# Patient Record
Sex: Male | Born: 1955 | Race: White | Hispanic: No | Marital: Single | Smoking: Current every day smoker
Health system: Southern US, Community
[De-identification: ages and names within clinical notes are randomized; demographics above are authoritative.]

## PROBLEM LIST (undated history)

## (undated) DIAGNOSIS — R12 Heartburn: Secondary | ICD-10-CM

## (undated) DIAGNOSIS — C801 Malignant (primary) neoplasm, unspecified: Secondary | ICD-10-CM

## (undated) HISTORY — PX: ANKLE FRACTURE SURGERY: SHX122

## (undated) HISTORY — DX: Malignant (primary) neoplasm, unspecified: C80.1

## (undated) HISTORY — DX: Heartburn: R12

---

## 2016-10-05 ENCOUNTER — Other Ambulatory Visit (HOSPITAL_COMMUNITY): Payer: Self-pay | Admitting: Oncology

## 2016-10-05 DIAGNOSIS — K769 Liver disease, unspecified: Secondary | ICD-10-CM

## 2016-10-12 ENCOUNTER — Ambulatory Visit (HOSPITAL_COMMUNITY)
Admission: RE | Admit: 2016-10-12 | Discharge: 2016-10-12 | Disposition: A | Discharge status: Home / Self Care | Payer: Medicaid Other | Source: Ambulatory Visit | Attending: Oncology | Admitting: Oncology

## 2016-10-12 DIAGNOSIS — R918 Other nonspecific abnormal finding of lung field: Secondary | ICD-10-CM | POA: Insufficient documentation

## 2016-10-12 DIAGNOSIS — J439 Emphysema, unspecified: Secondary | ICD-10-CM | POA: Insufficient documentation

## 2016-10-12 DIAGNOSIS — I2510 Atherosclerotic heart disease of native coronary artery without angina pectoris: Secondary | ICD-10-CM | POA: Insufficient documentation

## 2016-10-12 DIAGNOSIS — K769 Liver disease, unspecified: Secondary | ICD-10-CM | POA: Diagnosis present

## 2016-10-12 DIAGNOSIS — R160 Hepatomegaly, not elsewhere classified: Secondary | ICD-10-CM | POA: Diagnosis not present

## 2016-10-12 DIAGNOSIS — I700 Atherosclerosis of aorta: Secondary | ICD-10-CM | POA: Insufficient documentation

## 2016-10-12 MED ORDER — GADOBENATE DIMEGLUMINE 529 MG/ML IV SOLN
14.0000 mL | Freq: Once | INTRAVENOUS | Status: AC | PRN
  Administered 2016-10-12: 14 mL via INTRAVENOUS

## 2016-10-16 ENCOUNTER — Encounter (HOSPITAL_COMMUNITY): Payer: Medicaid Other | Attending: Hematology & Oncology | Admitting: Hematology & Oncology

## 2016-10-16 ENCOUNTER — Observation Stay (HOSPITAL_COMMUNITY)
Admission: EM | Admit: 2016-10-16 | Discharge: 2016-10-18 | Disposition: A | Discharge status: Home / Self Care | Payer: Medicaid Other | Attending: Internal Medicine | Admitting: Internal Medicine

## 2016-10-16 ENCOUNTER — Encounter (HOSPITAL_COMMUNITY): Payer: Self-pay | Admitting: Hematology & Oncology

## 2016-10-16 ENCOUNTER — Encounter (HOSPITAL_COMMUNITY): Payer: Self-pay

## 2016-10-16 VITALS — BP 157/105 | HR 111 | Temp 98.2°F | Resp 20 | Ht 66.25 in | Wt 139.2 lb

## 2016-10-16 DIAGNOSIS — C229 Malignant neoplasm of liver, not specified as primary or secondary: Secondary | ICD-10-CM | POA: Diagnosis not present

## 2016-10-16 DIAGNOSIS — C220 Liver cell carcinoma: Secondary | ICD-10-CM | POA: Diagnosis not present

## 2016-10-16 DIAGNOSIS — D75839 Thrombocytosis, unspecified: Secondary | ICD-10-CM | POA: Diagnosis present

## 2016-10-16 DIAGNOSIS — G893 Neoplasm related pain (acute) (chronic): Secondary | ICD-10-CM

## 2016-10-16 DIAGNOSIS — R109 Unspecified abdominal pain: Secondary | ICD-10-CM | POA: Diagnosis present

## 2016-10-16 DIAGNOSIS — F1010 Alcohol abuse, uncomplicated: Secondary | ICD-10-CM

## 2016-10-16 DIAGNOSIS — Z79899 Other long term (current) drug therapy: Secondary | ICD-10-CM | POA: Diagnosis not present

## 2016-10-16 DIAGNOSIS — F1910 Other psychoactive substance abuse, uncomplicated: Secondary | ICD-10-CM

## 2016-10-16 DIAGNOSIS — F17210 Nicotine dependence, cigarettes, uncomplicated: Secondary | ICD-10-CM | POA: Insufficient documentation

## 2016-10-16 DIAGNOSIS — K769 Liver disease, unspecified: Secondary | ICD-10-CM

## 2016-10-16 DIAGNOSIS — D72829 Elevated white blood cell count, unspecified: Secondary | ICD-10-CM | POA: Diagnosis present

## 2016-10-16 DIAGNOSIS — R1010 Upper abdominal pain, unspecified: Secondary | ICD-10-CM | POA: Diagnosis present

## 2016-10-16 DIAGNOSIS — D649 Anemia, unspecified: Secondary | ICD-10-CM | POA: Diagnosis present

## 2016-10-16 DIAGNOSIS — Z659 Problem related to unspecified psychosocial circumstances: Secondary | ICD-10-CM

## 2016-10-16 DIAGNOSIS — R945 Abnormal results of liver function studies: Secondary | ICD-10-CM | POA: Diagnosis present

## 2016-10-16 DIAGNOSIS — D473 Essential (hemorrhagic) thrombocythemia: Secondary | ICD-10-CM | POA: Diagnosis present

## 2016-10-16 DIAGNOSIS — R7989 Other specified abnormal findings of blood chemistry: Secondary | ICD-10-CM | POA: Diagnosis present

## 2016-10-16 LAB — LIPASE, BLOOD: LIPASE: 27 U/L (ref 11–51)

## 2016-10-16 LAB — CBC WITH DIFFERENTIAL/PLATELET
BASOS PCT: 0 %
Basophils Absolute: 0 10*3/uL (ref 0.0–0.1)
EOS PCT: 1 %
Eosinophils Absolute: 0.1 10*3/uL (ref 0.0–0.7)
HEMATOCRIT: 33.6 % — AB (ref 39.0–52.0)
Hemoglobin: 11.4 g/dL — ABNORMAL LOW (ref 13.0–17.0)
LYMPHS PCT: 17 %
Lymphs Abs: 2.2 10*3/uL (ref 0.7–4.0)
MCH: 32.1 pg (ref 26.0–34.0)
MCHC: 33.9 g/dL (ref 30.0–36.0)
MCV: 94.6 fL (ref 78.0–100.0)
MONO ABS: 1.7 10*3/uL — AB (ref 0.1–1.0)
Monocytes Relative: 13 %
NEUTROS PCT: 69 %
Neutro Abs: 9.2 10*3/uL — ABNORMAL HIGH (ref 1.7–7.7)
PLATELETS: 731 10*3/uL — AB (ref 150–400)
RBC: 3.55 MIL/uL — AB (ref 4.22–5.81)
RDW: 16.4 % — AB (ref 11.5–15.5)
WBC: 13.2 10*3/uL — AB (ref 4.0–10.5)

## 2016-10-16 LAB — COMPREHENSIVE METABOLIC PANEL
ALBUMIN: 2.6 g/dL — AB (ref 3.5–5.0)
ALT: 215 U/L — AB (ref 17–63)
AST: 249 U/L — AB (ref 15–41)
Alkaline Phosphatase: 632 U/L — ABNORMAL HIGH (ref 38–126)
Anion gap: 6 (ref 5–15)
BILIRUBIN TOTAL: 1.8 mg/dL — AB (ref 0.3–1.2)
BUN: 14 mg/dL (ref 6–20)
CHLORIDE: 101 mmol/L (ref 101–111)
CO2: 27 mmol/L (ref 22–32)
CREATININE: 0.81 mg/dL (ref 0.61–1.24)
Calcium: 9 mg/dL (ref 8.9–10.3)
GFR calc Af Amer: >60 mL/min (ref >60)
GLUCOSE: 98 mg/dL (ref 65–99)
POTASSIUM: 4.4 mmol/L (ref 3.5–5.1)
Sodium: 134 mmol/L — ABNORMAL LOW (ref 135–145)
Total Protein: 7.6 g/dL (ref 6.5–8.1)

## 2016-10-16 LAB — PROTIME-INR
INR: 0.9
Prothrombin Time: 12.1 seconds (ref 11.4–15.2)

## 2016-10-16 MED ORDER — NICOTINE 7 MG/24HR TD PT24: 7.0000 mg | MEDICATED_PATCH | Freq: Every day | TRANSDERMAL | Status: DC

## 2016-10-16 MED ORDER — ONDANSETRON HCL 4 MG PO TABS: 4.0000 mg | ORAL_TABLET | Freq: Four times a day (QID) | ORAL | Status: DC | PRN

## 2016-10-16 MED ORDER — FAMOTIDINE IN NACL 20-0.9 MG/50ML-% IV SOLN
20.0000 mg | Freq: Two times a day (BID) | INTRAVENOUS | Status: DC
  Administered 2016-10-16 – 2016-10-17 (×3): 20 mg via INTRAVENOUS
  Filled 2016-10-16 (×4): qty 50

## 2016-10-16 MED ORDER — MORPHINE SULFATE (PF) 4 MG/ML IV SOLN
4.0000 mg | INTRAVENOUS | Status: DC | PRN
  Administered 2016-10-17 – 2016-10-18 (×3): 4 mg via INTRAVENOUS
  Filled 2016-10-16 (×3): qty 1

## 2016-10-16 MED ORDER — SODIUM CHLORIDE 0.9 % IV SOLN
INTRAVENOUS | Status: DC
  Administered 2016-10-16 – 2016-10-17 (×2): via INTRAVENOUS

## 2016-10-16 MED ORDER — OXYCODONE HCL 5 MG PO TABS
ORAL_TABLET | ORAL | Status: AC
  Filled 2016-10-16: qty 2

## 2016-10-16 MED ORDER — OMEPRAZOLE 40 MG PO CPDR: 40.0000 mg | DELAYED_RELEASE_CAPSULE | Freq: Every day | ORAL | 1 refills | Status: AC

## 2016-10-16 MED ORDER — OXYCODONE HCL 10 MG PO TABS: 10.0000 mg | ORAL_TABLET | ORAL | 0 refills | Status: AC | PRN

## 2016-10-16 MED ORDER — ONDANSETRON HCL 4 MG/2ML IJ SOLN
4.0000 mg | Freq: Four times a day (QID) | INTRAMUSCULAR | Status: DC | PRN
  Administered 2016-10-17: 4 mg via INTRAVENOUS
  Filled 2016-10-16 (×2): qty 2

## 2016-10-16 MED ORDER — OXYCODONE HCL 5 MG PO TABS
10.0000 mg | ORAL_TABLET | Freq: Once | ORAL | Status: AC
  Administered 2016-10-16: 10 mg via ORAL

## 2016-10-16 MED ORDER — MORPHINE SULFATE (PF) 4 MG/ML IV SOLN
4.0000 mg | Freq: Once | INTRAVENOUS | Status: AC
  Administered 2016-10-16: 4 mg via INTRAVENOUS
  Filled 2016-10-16: qty 1

## 2016-10-16 NOTE — ED Provider Notes (Signed)
[facility] DEPT Provider Note   CSN: [id number] Arrival date & time: 10/16/16  1705     History   Chief Complaint Chief Complaint  Patient presents with  . Abdominal Pain    HPI Micheal Kim is a 60 y.o. male.  HPI The patient presents to the emergency room to be admitted for further cancer workup. Patient states he has been diagnosed with liver cancer. He has been having persistent pain in his upper abdomen. Patient followed up in the oncology office today. [facility]. They reviewed his recent imaging that included an MRI of the abdomen on November 9 and a CT scan of the chest on November 9. Those findings demonstrated innumerable liver masses throughout both lobes of the liver. Patient also had evidence of nodules on the chest CT. The patient was instructed to be admitted to the hospital for pain control and arrange for his biopsy. She denies any fevers or chills. No vomiting or diarrhea. Past Medical History:  Diagnosis Date  . Cancer [facility])    liver cancer  . Heartburn     There are no active problems to display for this patient.   Past Surgical History:  Procedure Laterality Date  . ANKLE FRACTURE SURGERY Right    x2       Home Medications    Prior to Admission medications   Medication Sig Start Date End Date Taking? Authorizing Provider  calcium carbonate (TUMS - DOSED IN MG ELEMENTAL CALCIUM) 500 MG chewable tablet Chew 10 tablets by mouth daily as needed for indigestion or heartburn.   Yes Historical Provider, MD  esomeprazole (NEXIUM 24HR) 20 MG capsule Take 20 mg by mouth daily at 12 noon.   Yes Historical Provider, MD  omeprazole (PRILOSEC) 40 MG capsule Take 1 capsule (40 mg total) by mouth daily. 10/16/16   Patrici Ranks, MD  oxyCODONE 10 MG TABS Take 1 tablet (10 mg total) by mouth every 4 (four) hours as needed for severe pain. 10/16/16   Patrici Ranks, MD    Family History Family History  Problem Relation Age of Onset  .  Colon cancer Mother   . Heart attack Father     Social History Social History  Substance Use Topics  . Smoking status: Current Every Day Smoker    Types: Cigarettes  . Smokeless tobacco: Never Used     Comment: started smoking age 32  . Alcohol use No     Comment: quit driking 10/04/16-drank 10 beers and /or 5th od liquer daily     Allergies   Patient has no known allergies.   Review of Systems Review of Systems  All other systems reviewed and are negative.    Physical Exam Updated Vital Signs BP 148/79 (BP Location: Left Arm)   Pulse 95   Temp 98.5 F (36.9 C) (Oral)   Resp 20   SpO2 97%   Physical Exam  Constitutional: No distress.  Thin, underweight  HENT:  Head: Normocephalic and atraumatic.  Right Ear: External ear normal.  Left Ear: External ear normal.  Eyes: Conjunctivae are normal. Right eye exhibits no discharge. Left eye exhibits no discharge. No scleral icterus.  Neck: Neck supple. No tracheal deviation present.  Cardiovascular: Normal rate, regular rhythm and intact distal pulses.   Pulmonary/Chest: Effort normal and breath sounds normal. No stridor. No respiratory distress. He has no wheezes. He has no rales.  Abdominal: Soft. Bowel sounds are normal. He exhibits mass. He exhibits no distension. There is  hepatomegaly. There is tenderness in the right upper quadrant. There is no rebound and no guarding.  Musculoskeletal: He exhibits no edema or tenderness.  Neurological: He is alert. He has normal strength. No cranial nerve deficit (no facial droop, extraocular movements intact, no slurred speech) or sensory deficit. He exhibits normal muscle tone. He displays no seizure activity. Coordination normal.  Skin: Skin is warm and dry. No rash noted.  Psychiatric: He has a normal mood and affect.  Nursing note and vitals reviewed.    ED Treatments / Results  Labs (all labs ordered are listed, but only abnormal results are displayed) Labs Reviewed  CBC  WITH DIFFERENTIAL/PLATELET - Abnormal; Notable for the following:       Result Value   WBC 13.2 (*)    RBC 3.55 (*)    Hemoglobin 11.4 (*)    HCT 33.6 (*)    RDW 16.4 (*)    Platelets 731 (*)    Neutro Abs 9.2 (*)    Monocytes Absolute 1.7 (*)    All other components within normal limits  COMPREHENSIVE METABOLIC PANEL - Abnormal; Notable for the following:    Sodium 134 (*)    Albumin 2.6 (*)    AST 249 (*)    ALT 215 (*)    Alkaline Phosphatase 632 (*)    Total Bilirubin 1.8 (*)    All other components within normal limits  LIPASE, BLOOD  PROTIME-INR     Procedures Procedures (including critical care time)  Medications Ordered in ED Medications  morphine 4 MG/ML injection 4 mg (4 mg Intravenous Given 10/16/16 1753)     Initial Impression / Assessment and Plan / ED Course  I have reviewed the triage vital signs and the nursing notes.  Pertinent labs & imaging results that were available during my care of the patient were reviewed by me and considered in my medical decision making (see chart for details).  Clinical Course    Patient has a palpable tender liver mass.Laboratory tests are abnormal with elevated LFTs and bilirubin I reviewed the notes from his recent visit on the oncology office. Pt has poor social and financial support.  Admission recommended by oncology. I will order basic labs and consult with medical service to have the patient admitted for his further workup  Final Clinical Impressions(s) / ED Diagnoses   Final diagnoses:  Hepatocellular carcinoma ([place])     Dorie Rank, MD 10/16/16 [phone]

## 2016-10-16 NOTE — Progress Notes (Signed)
[place]  CONSULT NOTE  No care team member to display  CHIEF COMPLAINTS/PURPOSE OF CONSULTATION:  Extensive hepatic abnormality Substance and alcohol abuse RUQ pain  HISTORY OF PRESENTING ILLNESS:  Micheal Kim 60 y.o. male is here because of referral from [facility] ED.   Micheal Kim is a pleasant 60 y.o. who presented to [facility] ED on 10/04/2016 complaining of gradually worsening right flank pain for the past 2 weeks. This pain was exacerbated with breathing and his abdomen was distended. Accordingly a CT Abdomen/Pelvis was performed revealing progression of previously demonstrated masses involving the left hepatic lobe. There was extensive hepatic abnormality, most concerning for multifocal hepatocellular carcinoma in the setting of underlying cirrhosis. Multifocal metastases from an unknown primary less likely. There was also a new small left adrenal nodule, potentially a metastasis. These results were discussed with the patient while in the hospital.   Additional imaging was performed at [facility] including:   10/12/16 MR Abdomen showed innumerable liver masses throughout both lobes of the liver. There was also an enhancing lesion within the left adrenal gland that does not have imaging features of a benign adenoma. This is a new finding from 2015 and is worrisome for metastatic disease.   10/12/16 CT Chest showed a sub solid nodule within the right upper lobe measuring 14 mm. Also seen was a single solid nodule within the subpleural right lower lobe measuring 6 mm.  Micheal Kim presents to the [facility] today unaccompanied.  When he first started getting sick he just assumed he was getting a beer gut about a year ago. He started to feel pain and assumed it was his appendix about to rupture so he went to [facility] ED. He was referred to Dr. Britta Mccreedy who does not manage cancer and was then referred to our clinic.   He smokes about 4  cigarettes daily. He quit drinking alcohol the first of the month.   He reports dry mouth and bitter taste in his mouth. He denies difficulty swallowing.  He reports a knot in his right flank and a knot over his heart that is sore, "I'm eat up". He feels okay enough to go home versus being hospitalized for pain control.  He is not taking anything for pain. Although clearly uncomfortable thoughout our entire visit.   He likes Ensure.   He does not drive. He plans to ride a bus home after his visit today. He does not have any insurance right now. He is able to get to [facility] if he has to, stating he can ride the bus.  When asked how he is doing with all of this he states "It is what it is. A lot of it is self induced from partying throughout the years. I made my bed and I'm gonna lie in it". Admits "It has hit me so quick". He previously had insurance through his wife, "I lost everything I had".   Sometimes he feels like there is mucous coming out of right nostril but when he blows his nose it is blood. This has been happening for the last week. It mostly happens in the mornings. He admits he has electric heat and it may be from the dry heat.  He has been eating very little. His heaviest was probably about 150 lbs.   He understands his liver is enlarged, "I am surprised I can function".   The patient is here for further evaluation and discussion of extensive hepatic disease.  MEDICAL HISTORY:  Past Medical History:  Diagnosis Date  . Cancer [facility])    liver cancer  . Heartburn     SURGICAL HISTORY: Past Surgical History:  Procedure Laterality Date  . ANKLE FRACTURE SURGERY Right    x2    SOCIAL HISTORY: Social History   Social History  . Marital status: Single    Spouse name: N/A  . Number of children: N/A  . Years of education: N/A   Occupational History  . Not on file.   Social History Main Topics  . Smoking status: Current Every Day Smoker    Types: Cigarettes    . Smokeless tobacco: Never Used     Comment: started smoking age 73  . Alcohol use No     Comment: quit driking 10/04/16-drank 10 beers and /or 5th od liquer daily  . Drug use:     Types: Marijuana     Comment: states he has 'done it all' but quit "year ago"  . Sexual activity: Not Currently     Comment: single   Other Topics Concern  . Not on file   Social History Narrative  . No narrative on file  He smokes about 4 cigarettes daily. He quit drinking alcohol the first of the month. Smokes marijuana.  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  He does not drive. He is able to get to [facility] if he has to, stating he can ride the bus. He lives with his son-in-law and an old friend who helps him pay bills  FAMILY HISTORY: Family History  Problem Relation Age of Onset  . Colon cancer Mother   . Heart attack Father     Mother deceased 76 years-old of colon cancer and several different ailments. Father deceased at 15 yars-old of massive heart attack. One sister living about 10 years younger than the patient. They are not that close.  ALLERGIES:  has No Known Allergies.  MEDICATIONS:  Current Outpatient Prescriptions  Medication Sig Dispense Refill  . calcium carbonate (TUMS - DOSED IN MG ELEMENTAL CALCIUM) 500 MG chewable tablet Chew 10 tablets by mouth daily as needed for indigestion or heartburn.    Marland Kitchen omeprazole (PRILOSEC) 40 MG capsule Take 1 capsule (40 mg total) by mouth daily. 30 capsule 1  . oxyCODONE 10 MG TABS Take 1 tablet (10 mg total) by mouth every 4 (four) hours as needed for severe pain. 90 tablet 0   No current facility-administered medications for this visit.     Review of Systems  Constitutional: Negative.   HENT: Positive for nosebleeds.        Dry mouth and bitter taste in mouth  Eyes: Negative.   Respiratory: Negative.   Cardiovascular: Positive for  chest pain.  Gastrointestinal: Positive for abdominal pain and heartburn.       Right flank pain  Genitourinary: Negative.   Musculoskeletal: Negative.   Skin: Negative.   Neurological: Negative.   Endo/Heme/Allergies: Negative.   Psychiatric/Behavioral: Negative.   All other systems reviewed and are negative.  14 point ROS was done and is otherwise as detailed above or in HPI   PHYSICAL EXAMINATION: ECOG PERFORMANCE STATUS: 2 - Symptomatic, <50% confined to bed  Vitals:   10/16/16 1422  BP: (!) 157/105  Pulse: (!) 111  Resp: 20  Temp: 98.2 F (36.8 C)   Filed Weights   10/16/16 1422  Weight: 139 lb 3.2 oz (63.1 kg)    Physical Exam  Constitutional: He is oriented to person, place, and time and well-developed, well-nourished, and in no distress.  HENT:  Head: Normocephalic and atraumatic.  Mouth/Throat: Oropharynx is clear and moist. No oropharyngeal exudate.  Eyes: Conjunctivae and EOM are normal. Pupils are equal, round, and reactive to light. No scleral icterus.  Neck: Normal range of motion. Neck supple.  Cardiovascular: Normal rate, regular rhythm and normal heart sounds.   Pulmonary/Chest: Effort normal and breath sounds normal. No respiratory distress. He has no wheezes. He has no rales. He exhibits no tenderness.  Abdominal: Soft. Bowel sounds are normal. He exhibits distension. There is tenderness. There is guarding.  Hepatomegaly  Musculoskeletal: Normal range of motion. He exhibits edema.  Lymphadenopathy:    He has no cervical adenopathy.  Neurological: He is alert and oriented to person, place, and time. Gait normal.  Skin: Skin is warm and dry.  Psychiatric: Mood, memory, affect and judgment normal.  Nursing note and vitals reviewed.   LABORATORY DATA:  I have reviewed the data as listed       RADIOGRAPHIC STUDIES: I have personally reviewed the radiological images as listed and agreed with the findings in the report. Ct Chest Wo  Contrast  Result Date: 10/13/2016 CLINICAL DATA:  History of liver cancer. EXAM: CT CHEST WITHOUT CONTRAST TECHNIQUE: Multidetector CT imaging of the chest was performed following the standard protocol without IV contrast. COMPARISON:  None FINDINGS: Cardiovascular: The heart size appears normal. No pericardial effusion. Aortic atherosclerosis noted. Calcification in the LAD coronary artery noted. Mediastinum/Nodes: The trachea appears patent and is midline. Normal appearance of the esophagus. There is no mediastinal or hilar adenopathy. Left lobe of thyroid gland is absent or atrophic. No mediastinal or hilar adenopathy. No axillary or supraclavicular adenopathy. Lungs/Pleura: There is no pleural fluid identified. Moderate changes of paraseptal and centrilobular emphysema identified. Within the right lung apex there is a part solid nodule which measures 1.4 cm, image 25 of series 3. There is a subpleural nodule which overlies the posterior medial right lower lobe. This measures 6 mm, image 69 of series 3. Upper Abdomen: Cirrhotic appearing liver containing multiple masses are identified. See report from MRI dated 10/12/2016 for more details. 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 There are no aggressive lytic or sclerotic bone lesions. IMPRESSION: 1. No acute cardiopulmonary abnormalities. 2. There is a sub solid nodule within the right upper lobe measuring 14 mm. Follow-up non-contrast CT recommended at 3-6 months to confirm persistence. If unchanged, and solid component remains <6 mm, annual CT is recommended until 5 years of stability has been established. If persistent these nodules should be considered highly suspicious if the solid component of the nodule is 6 mm or greater in size and enlarging. This recommendation follows the consensus statement: Guidelines for Management of Incidental Pulmonary Nodules Detected on CT Images: From the Fleischner Society 2017;  Radiology 2017; 284:228-243. 3. Single solid nodule within the subpleural right lower lobe measures 6 mm. Non-contrast chest CT at 6-12 months is recommended. If the nodule is stable at time of repeat CT, then future CT at 18-24 months (from today's scan) is recommended for high-risk patients. This recommendation follows the consensus statement: Guidelines for Management of Incidental Pulmonary Nodules Detected on CT Images:From the Fleischner Society 2017; published online before print (10.1148/radiol.IJ:2314499). 4. Aortic atherosclerosis and coronary artery calcification 5. Emphysema. Electronically Signed   By: Queen Slough.D.  On: 10/13/2016 09:23   Mr Abdomen W Wo Contrast  Result Date: 10/13/2016 CLINICAL DATA:  Evaluate liver lesions. EXAM: MRI ABDOMEN WITHOUT AND WITH CONTRAST TECHNIQUE: Multiplanar multisequence MR imaging of the abdomen was performed both before and after the administration of intravenous contrast. CONTRAST:  28mL MULTIHANCE GADOBENATE DIMEGLUMINE 529 MG/ML IV SOLN COMPARISON:  08/17/14 FINDINGS: Lower chest: No acute findings. Hepatobiliary: The liver is enlarged. There are innumerable masses of varying size throughout both lobes of the liver worrisome for multifocal about a cellular carcinoma versus diffuse metastatic disease. Index lesion within posterior right lobe of liver measures 2.7 cm, image 19 of series 21. Index lesion within the lateral segment of left lobe of liver measures 3.5 cm, image 60 of series 5009 lesion within the medial segment of left lobe of liver measures 2.3 cm, image 35 of series 5009. Large exophytic lesion arising from the far lateral aspect of the left lobe of liver measures 5.4 cm, image 22 of series 5009. Pancreas: No mass, inflammatory changes, or other parenchymal abnormality identified. Spleen:  Within normal limits in size and appearance. Adrenals/Urinary Tract: The right adrenal gland appears normal. There is a solid enhancing nodule in the  left adrenal gland which measures 1.3 cm, image 30 of series 5005. Hemorrhagic cyst in the right kidney measures 6 mm. Unremarkable appearance of the left kidney. Several simple appearing cysts are identified within the inferior pole of the left kidney. No mass or hydronephrosis noted. Stomach/Bowel: Visualized portions within the abdomen are unremarkable. Vascular/Lymphatic: No pathologically enlarged lymph nodes identified. No abdominal aortic aneurysm demonstrated. Other:  There is a small volume of perihepatic ascites. Musculoskeletal: No suspicious bone lesions identified. IMPRESSION: 1. Innumerable liver masses are identified throughout both lobes of liver. 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 Differential considerations include diffuse liver metastases from unknown primary. 2. Enhancing lesion within left adrenal gland does not have imaging features of a benign adenoma. This is a new finding from 2015 and is worrisome for metastatic disease. Electronically Signed   By: Kerby Moors M.D.   On: 10/13/2016 10:24      ASSESSMENT & PLAN:  Extensive hepatic metastatic disease Substance and alcohol abuse RUQ pain Abdominal distension Abnormal LFT's  The patient is here for further evaluation and discussion of innumerable liver masses in the setting of cirrhosis. Has a known history of alcohol use. Hepatitis serologies are unknown.   10/12/16 MR Abdomen reviewed. This showed innumerable liver masses throughout both lobes of the liver. There was also an enhancing lesion within the left adrenal gland that does not have imaging features of a benign adenoma. This is a new finding from 2015 and is worrisome for metastatic disease.   10/12/16 CT Chest reviewed. This showed a sub solid nodule within the right upper lobe measuring 14 mm. Also seen was a single solid nodule within the subpleural right lower lobe measuring 6 mm.   He will be set up for  ultrasound guided biopsy of hepatic mass. We will send tumor markers and do labs today. I will see if the patient can have his biopsy performed at [facility] due to transportation issues.   Given pain medication Oxycodone and heartburn medication Nexium in our clinic today.  I have prescribed oxycodone, omeprazole, and steroid. Hopefully dexamethasone will help his liver capsular pain. If he has [place], his disease is impressive, he is not a candidate for liver directed therapies, I do not feel given his declining PS he  would tolerate nexavar. We will regroup post biopsy.   I will speak with Ovid Curd about getting Ensure samples.  He does not have insurance. He does not have transportation. He took 3 buses to get here from [place].   After our visit, the patient decided he would like to be admitted to the hospital for pain control. This was arranged. Will obtain labs on the inpatient service including hepatitis serologies and alpha fetoprotein.   He will return for follow up within 3 days post biopsy to discuss these results.   ORDERS PLACED FOR THIS ENCOUNTER: No orders of the defined types were placed in this encounter.  Orders Placed This Encounter  Procedures  . Ambulatory referral to Social Work    Referral Priority:   Routine    Referral Type:   Consultation    Referral Reason:   Specialty Services Required    Number of Visits Requested:   1     MEDICATIONS PRESCRIBED THIS ENCOUNTER: Meds ordered this encounter  Medications  . calcium carbonate (TUMS - DOSED IN MG ELEMENTAL CALCIUM) 500 MG chewable tablet    Sig: Chew 10 tablets by mouth daily as needed for indigestion or heartburn.  Marland Kitchen oxyCODONE (Oxy IR/ROXICODONE) immediate release tablet 10 mg  . oxyCODONE 10 MG TABS    Sig: Take 1 tablet (10 mg total) by mouth every 4 (four) hours as needed for severe pain.    Dispense:  90 tablet    Refill:  0  . omeprazole (PRILOSEC) 40 MG capsule    Sig: Take 1 capsule (40 mg total) by mouth  daily.    Dispense:  30 capsule    Refill:  1    All questions were answered. The patient knows to call the clinic with any problems, questions or concerns.  This document serves as a record of services personally performed by Ancil Linsey, MD. It was created on her behalf by Arlyce Harman, a trained medical scribe. The creation of this record is based on the scribe's personal observations and the provider's statements to them. This document has been checked and approved by the attending provider.  I have reviewed the above documentation for accuracy and completeness and I agree with the above.  This note was electronically signed.  Molli Hazard, MD   10/16/2016 3:16 PM

## 2016-10-16 NOTE — ED Notes (Signed)
EKG @ 17:30 completed. Did not print . No order.

## 2016-10-16 NOTE — ED Triage Notes (Signed)
[facility] states he does not know why he is here. States he thinks he was sent down here to be admit to the hospital.

## 2016-10-16 NOTE — ED Notes (Signed)
Pt now states he was here on the 9th and thinks he was suppose to come back today for a follow up visit. States some woman came in his room and ask him if he wanted to stay over night in the hospital.

## 2016-10-16 NOTE — H&P (Signed)
History and Physical    Micheal Kim [id number] DOB: 16-Mar-1956 DOA: 10/16/2016  PCP: No PCP Per Patient   Patient coming from: Oncology clinic.  Chief Complaint: Abdominal pain.  HPI: Micheal Kim is a 60 y.o. male with medical history significant of liver cancer who is coming to the emergency department referred by the oncology clinic for treatment of abdominal pain and possible biopsy in the morning if possible for liver metastases.  Per patient, he started having abdominal pain about a year ago. Since then he states that he has lost about 15-20 pounds of weight. However, he states that lately his abdominal pain has increased in intensity. He complains of occasional nausea, but denies recent emesis, diarrhea or constipation. He denies chest pain, dyspnea, palpitations, pitting edema of the lower extremities, PND orthopnea. He denies GU symptoms.  ED Course: Workup in the emergency department showed WBC of 13.2, hemoglobin level of 11.3 g/dL, Thrombocytosis of unknown 131, albumin 2.6 g/dL, bilirubin 1.8 mg/dL,. AST 249, ALT 215, alkaline phosphatase 632. Recent imaging demonstrates multiple liver lesions.  Review of Systems: As per HPI otherwise 10 point review of systems negative.    Past Medical History:  Diagnosis Date  . Cancer [facility])    liver cancer  . Heartburn     Past Surgical History:  Procedure Laterality Date  . ANKLE FRACTURE SURGERY Right    x2     reports that he has been smoking Cigarettes.  He has never used smokeless tobacco. He reports that he uses drugs, including Marijuana. He reports that he does not drink alcohol.  No Known Allergies  Family History  Problem Relation Age of Onset  . Colon cancer Mother   . Heart attack Father    Prior to Admission medications   Medication Sig Start Date End Date Taking? Authorizing Provider  calcium carbonate (TUMS - DOSED IN MG ELEMENTAL CALCIUM) 500 MG chewable tablet Chew 10 tablets by mouth daily  as needed for indigestion or heartburn.   Yes Historical Provider, MD  esomeprazole (NEXIUM 24HR) 20 MG capsule Take 20 mg by mouth daily at 12 noon.   Yes Historical Provider, MD  omeprazole (PRILOSEC) 40 MG capsule Take 1 capsule (40 mg total) by mouth daily. 10/16/16   Patrici Ranks, MD  oxyCODONE 10 MG TABS Take 1 tablet (10 mg total) by mouth every 4 (four) hours as needed for severe pain. 10/16/16   Patrici Ranks, MD    Physical Exam: Vitals:   10/16/16 1711 10/16/16 1954 10/16/16 2300  BP: 148/79 146/87 132/77  Pulse: 95 101 98  Resp: 20 20 20   Temp: 98.5 F (36.9 C) 98.7 F (37.1 C) 97.7 F (36.5 C)  TempSrc: Oral Oral Oral  SpO2: 97% 97% 98%      Constitutional: NAD, calm, comfortable Vitals:   10/16/16 1711 10/16/16 1954 10/16/16 2300  BP: 148/79 146/87 132/77  Pulse: 95 101 98  Resp: 20 20 20   Temp: 98.5 F (36.9 C) 98.7 F (37.1 C) 97.7 F (36.5 C)  TempSrc: Oral Oral Oral  SpO2: 97% 97% 98%   Eyes: PERRL, lids and conjunctivae normal ENMT: Mucous membranes are moist. Posterior pharynx clear of any exudate or lesions. Neck: normal, supple, no masses, no thyromegaly Respiratory: clear to auscultation bilaterally, no wheezing, no crackles. Normal respiratory effort. No accessory muscle use.  Cardiovascular: Regular rate and rhythm, no murmurs / rubs / gallops. No extremity edema. 2+ pedal pulses. No carotid bruits.  Abdomen: Soft, Positive RUQ  and epigastric tenderness, No guarding/rebound/ tenderness, no masses palpated. No hepatosplenomegaly. Bowel sounds positive.  Musculoskeletal: no clubbing / cyanosis. Good ROM, no contractures. Normal muscle tone.  Skin: no rashes, lesions, ulcers. Neurologic: CN 2-12 grossly intact. Sensation intact, DTR normal. Strength 5/5 in all 4.  Psychiatric: Normal judgment and insight. Alert and oriented x 4. Normal mood.    Labs on Admission: I have personally reviewed following labs and imaging  studies  CBC:  Recent Labs Lab 10/16/16 1731  WBC 13.2*  NEUTROABS 9.2*  HGB 11.4*  HCT 33.6*  MCV 94.6  PLT [id number]*   Basic Metabolic Panel:  Recent Labs Lab 10/16/16 1731  NA 134*  K 4.4  CL 101  CO2 27  GLUCOSE 98  BUN 14  CREATININE 0.81  CALCIUM 9.0   GFR: Estimated Creatinine Clearance: 88.7 mL/min (by C-G formula based on SCr of 0.81 mg/dL). Liver Function Tests:  Recent Labs Lab 10/16/16 1731  AST 249*  ALT 215*  ALKPHOS 632*  BILITOT 1.8*  PROT 7.6  ALBUMIN 2.6*    Recent Labs Lab 10/16/16 1731  LIPASE 27   No results for input(s): AMMONIA in the last 168 hours. Coagulation Profile:  Recent Labs Lab 10/16/16 1731  INR 0.90   Cardiac Enzymes: No results for input(s): CKTOTAL, CKMB, CKMBINDEX, TROPONINI in the last 168 hours. BNP (last 3 results) No results for input(s): PROBNP in the last 8760 hours. HbA1C: No results for input(s): HGBA1C in the last 72 hours. CBG: No results for input(s): GLUCAP in the last 168 hours. Lipid Profile: No results for input(s): CHOL, HDL, LDLCALC, TRIG, CHOLHDL, LDLDIRECT in the last 72 hours. Thyroid Function Tests: No results for input(s): TSH, T4TOTAL, FREET4, T3FREE, THYROIDAB in the last 72 hours. Anemia Panel: No results for input(s): VITAMINB12, FOLATE, FERRITIN, TIBC, IRON, RETICCTPCT in the last 72 hours. Urine analysis: No results found for: COLORURINE, APPEARANCEUR, LABSPEC, PHURINE, GLUCOSEU, HGBUR, BILIRUBINUR, KETONESUR, PROTEINUR, UROBILINOGEN, NITRITE, LEUKOCYTESUR   Radiological Exams on Admission:  CLINICAL DATA:  History of liver cancer.  EXAM: CT CHEST WITHOUT CONTRAST from 10/13/2016  TECHNIQUE: Multidetector CT imaging of the chest was performed following the standard protocol without IV contrast.  COMPARISON:  None  FINDINGS: Cardiovascular: The heart size appears normal. No pericardial effusion. Aortic atherosclerosis noted. Calcification in the LAD coronary artery  noted.  Mediastinum/Nodes: The trachea appears patent and is midline. Normal appearance of the esophagus. There is no mediastinal or hilar adenopathy. Left lobe of thyroid gland is absent or atrophic. No mediastinal or hilar adenopathy. No axillary or supraclavicular adenopathy.  Lungs/Pleura: There is no pleural fluid identified. Moderate changes of paraseptal and centrilobular emphysema identified. Within the right lung apex there is a part solid nodule which measures 1.4 cm, image 25 of series 3. There is a subpleural nodule which overlies the posterior medial right lower lobe. This measures 6 mm, image 69 of series 3.  Upper Abdomen: Cirrhotic appearing liver containing multiple masses are identified. See report from MRI dated 10/12/2016 for more details.  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 There are no aggressive lytic or sclerotic bone lesions.  IMPRESSION: 1. No acute cardiopulmonary abnormalities. 2. There is a sub solid nodule within the right upper lobe measuring 14 mm. Follow-up non-contrast CT recommended at 3-6 months to confirm persistence. If unchanged, and solid component remains <6 mm, annual CT is recommended until 5 years of stability has been established. If persistent these nodules should be considered highly  suspicious if the solid component of the nodule is 6 mm or greater in size and enlarging. This recommendation follows the consensus statement: Guidelines for Management of Incidental Pulmonary Nodules Detected on CT Images: From the Fleischner Society 2017; Radiology 2017; 284:228-243. 3. Single solid nodule within the subpleural right lower lobe measures 6 mm. Non-contrast chest CT at 6-12 months is recommended. If the nodule is stable at time of repeat CT, then future CT at 18-24 months (from today's scan) is recommended for high-risk patients. This recommendation follows the consensus  statement: Guidelines for Management of Incidental Pulmonary Nodules Detected on CT Images:From the Fleischner Society 2017; published online before print (10.1148/radiol.IJ:2314499). 4. Aortic atherosclerosis and coronary artery calcification 5. Emphysema.   Electronically Signed   By: Kerby Moors M.D.   On: 10/13/2016 09:23  CLINICAL DATA:  Evaluate liver lesions.  EXAM:  MRI ABDOMEN WITHOUT AND WITH CONTRAST  TECHNIQUE: Multiplanar multisequence MR imaging of the abdomen was performed both before and after the administration of intravenous contrast.  CONTRAST:  26mL MULTIHANCE GADOBENATE DIMEGLUMINE 529 MG/ML IV SOLN  COMPARISON:  08/17/14  FINDINGS: Lower chest: No acute findings.  Hepatobiliary: The liver is enlarged. There are innumerable masses of varying size throughout both lobes of the liver worrisome for multifocal about a cellular carcinoma versus diffuse metastatic disease. Index lesion within posterior right lobe of liver measures 2.7 cm, image 19 of series 21. Index lesion within the lateral segment of left lobe of liver measures 3.5 cm, image 60 of series 5009 lesion within the medial segment of left lobe of liver measures 2.3 cm, image 35 of series 5009. Large exophytic lesion arising from the far lateral aspect of the left lobe of liver measures 5.4 cm, image 22 of series 5009.  Pancreas: No mass, inflammatory changes, or other parenchymal abnormality identified.  Spleen:  Within normal limits in size and appearance.  Adrenals/Urinary Tract: The right adrenal gland appears normal. There is a solid enhancing nodule in the left adrenal gland which measures 1.3 cm, image 30 of series 5005. Hemorrhagic cyst in the right kidney measures 6 mm. Unremarkable appearance of the left kidney. Several simple appearing cysts are identified within the inferior pole of the left kidney. No mass or hydronephrosis noted.  Stomach/Bowel: Visualized  portions within the abdomen are unremarkable.  Vascular/Lymphatic: No pathologically enlarged lymph nodes identified. No abdominal aortic aneurysm demonstrated.  Other:  There is a small volume of perihepatic ascites.  Musculoskeletal: No suspicious bone lesions identified.  IMPRESSION: 1. Innumerable liver masses are identified throughout both lobes of liver. 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 Differential considerations include diffuse liver metastases from unknown primary. 2. Enhancing lesion within left adrenal gland does not have imaging features of a benign adenoma. This is a new finding from 2015 and is worrisome for metastatic disease.   Electronically Signed   By: Kerby Moors M.D.   On: 10/13/2016 10:24  Assessment/Plan Principal Problem:   Intractable abdominal pain Admit to MedSurg/observation Keep nothing by mouth. Gentle IV hydration. Continue morphine 4 mg IVP every 2 hours when necessary. Zofran 4 mg IVP every 6 hours as needed for nausea. Famotidine 20 mg IVP every 12 hours.  Active Problems:   Liver masses   Abnormal LFTs (liver function tests) Secondary to liver masses. Follow-up LFTs in the morning.    Anemia Check anemia profile. Monitor hematocrit and hemoglobin.    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 Follow-up platelet levels in  the morning.    Leukocytosis Denies fever or chills.  Denies productive cough or dysuria. Start IV antibiotics if the patient develops a fever Follow-up WBC in the morning.    DVT prophylaxis: SCDs. Code Status: Full code. Family Communication:  Disposition Plan: Admit for pain control and if possible CT-guided biopsy of hepatic lesions. Consults called:  Admission status: Observation/MedSurg.   Reubin Milan MD [facility] Pager [phone].  If 7PM-7AM, please contact night-coverage [email or web address] Password  [facility]  10/16/2016, 11:38 PM

## 2016-10-16 NOTE — Progress Notes (Signed)
Patient transported to ER via wheelchair in stable condition. Report given to ER nurse by Lupita Raider, RN.

## 2016-10-16 NOTE — ED Notes (Signed)
Nurse from cancer center called and stated pt was to be admitted for pain control and hopefully have a liver biopsy tomorrow.

## 2016-10-16 NOTE — Patient Instructions (Addendum)
Micheal Kim at [facility] Discharge Instructions  RECOMMENDATIONS MADE BY THE CONSULTANT AND ANY TEST RESULTS WILL BE SENT TO YOUR REFERRING PHYSICIAN.  You saw Dr.Penland today. You will be admitted to hospital for pain management. Hopefully, you can have biopsy while admitted.  See Amy at checkout for appointments.  Thank you for choosing [place] at [facility] to provide your oncology and hematology care.  To afford each patient quality time with our provider, please arrive at least 15 minutes before your scheduled appointment time.   Beginning January 23rd 2017 lab work for the [facility] will be done in the  Main lab at [facility] on 1st floor. If you have a lab appointment with the [facility] please come in thru the  Main Entrance and check in at the main information desk  You need to re-schedule your appointment should you arrive 10 or more minutes late.  We strive to give you quality time with our providers, and arriving late affects you and other patients whose appointments are after yours.  Also, if you no show three or more times for appointments you may be dismissed from the clinic at the providers discretion.     Again, thank you for choosing [facility].  Our hope is that these requests will decrease the amount of time that you wait before being seen by our physicians.       _____________________________________________________________  Should you have questions after your visit to [facility], please contact our office at [phone] between the hours of 8:30 a.m. and 4:30 p.m.  Voicemails left after 4:30 p.m. will not be returned until the following business day.  For prescription refill requests, have your pharmacy contact our office.         Resources For Cancer Patients and their Caregivers ? American Cancer Society: Can assist with transportation, wigs, general needs, runs Look Good  Feel Better.        (715)495-3284 ? Cancer Care: Provides financial assistance, online support groups, medication/co-pay assistance.  1-800-813-HOPE 2692527879) ? [place] Assists [place] cancer patients and their families through emotional , educational and financial support.  319-723-1176 ? [place] DSS Where to apply for food stamps, Medicaid and utility assistance. 4025244302 ? [facility]: Transportation to medical appointments. 847-396-5053 ? Social Security Administration: May apply for disability if have a Stage IV cancer. (530)461-7113 605-513-2771 ? [employer], Disability and Transit Services: Assists with nutrition, care and transit needs. [place] Support Programs: @10RELATIVEDAYS @ > Cancer Support Group  2nd Tuesday of the month 1pm-2pm, Journey Room  > Creative Journey  3rd Tuesday of the month 1130am-1pm, Journey Room  > Look Good Feel Better  1st Wednesday of the month 10am-12 noon, Journey Room (Call [place] to register [phone])

## 2016-10-17 ENCOUNTER — Encounter (HOSPITAL_COMMUNITY): Payer: Self-pay

## 2016-10-17 ENCOUNTER — Ambulatory Visit (HOSPITAL_COMMUNITY)
Admit: 2016-10-17 | Discharge: 2016-10-17 | Disposition: A | Discharge status: Home / Self Care | Payer: Medicaid Other | Attending: Oncology | Admitting: Oncology

## 2016-10-17 DIAGNOSIS — R7989 Other specified abnormal findings of blood chemistry: Secondary | ICD-10-CM | POA: Diagnosis not present

## 2016-10-17 DIAGNOSIS — R109 Unspecified abdominal pain: Secondary | ICD-10-CM | POA: Diagnosis not present

## 2016-10-17 DIAGNOSIS — K769 Liver disease, unspecified: Secondary | ICD-10-CM

## 2016-10-17 DIAGNOSIS — R188 Other ascites: Secondary | ICD-10-CM | POA: Insufficient documentation

## 2016-10-17 DIAGNOSIS — Z79899 Other long term (current) drug therapy: Secondary | ICD-10-CM | POA: Diagnosis not present

## 2016-10-17 DIAGNOSIS — Z79891 Long term (current) use of opiate analgesic: Secondary | ICD-10-CM | POA: Insufficient documentation

## 2016-10-17 DIAGNOSIS — I700 Atherosclerosis of aorta: Secondary | ICD-10-CM | POA: Diagnosis not present

## 2016-10-17 DIAGNOSIS — C220 Liver cell carcinoma: Secondary | ICD-10-CM | POA: Insufficient documentation

## 2016-10-17 DIAGNOSIS — J439 Emphysema, unspecified: Secondary | ICD-10-CM | POA: Diagnosis not present

## 2016-10-17 DIAGNOSIS — F17210 Nicotine dependence, cigarettes, uncomplicated: Secondary | ICD-10-CM | POA: Insufficient documentation

## 2016-10-17 DIAGNOSIS — Z9889 Other specified postprocedural states: Secondary | ICD-10-CM | POA: Insufficient documentation

## 2016-10-17 LAB — CBC WITH DIFFERENTIAL/PLATELET
Basophils Absolute: 0 10*3/uL (ref 0.0–0.1)
Basophils Relative: 0 %
Eosinophils Absolute: 0.2 10*3/uL (ref 0.0–0.7)
Eosinophils Relative: 1 %
HEMATOCRIT: 31.1 % — AB (ref 39.0–52.0)
HEMOGLOBIN: 10.7 g/dL — AB (ref 13.0–17.0)
LYMPHS ABS: 2.8 10*3/uL (ref 0.7–4.0)
LYMPHS PCT: 23 %
MCH: 32.5 pg (ref 26.0–34.0)
MCHC: 34.4 g/dL (ref 30.0–36.0)
MCV: 94.5 fL (ref 78.0–100.0)
MONO ABS: 1.7 10*3/uL — AB (ref 0.1–1.0)
MONOS PCT: 14 %
NEUTROS ABS: 7.5 10*3/uL (ref 1.7–7.7)
NEUTROS PCT: 62 %
Platelets: 620 10*3/uL — ABNORMAL HIGH (ref 150–400)
RBC: 3.29 MIL/uL — ABNORMAL LOW (ref 4.22–5.81)
RDW: 16.1 % — ABNORMAL HIGH (ref 11.5–15.5)
WBC: 12.2 10*3/uL — ABNORMAL HIGH (ref 4.0–10.5)

## 2016-10-17 LAB — COMPREHENSIVE METABOLIC PANEL
ALBUMIN: 2.2 g/dL — AB (ref 3.5–5.0)
ALK PHOS: 541 U/L — AB (ref 38–126)
ALT: 175 U/L — AB (ref 17–63)
AST: 200 U/L — AB (ref 15–41)
Anion gap: 6 (ref 5–15)
BILIRUBIN TOTAL: 2 mg/dL — AB (ref 0.3–1.2)
BUN: 14 mg/dL (ref 6–20)
CALCIUM: 8.4 mg/dL — AB (ref 8.9–10.3)
CO2: 24 mmol/L (ref 22–32)
CREATININE: 0.61 mg/dL (ref 0.61–1.24)
Chloride: 102 mmol/L (ref 101–111)
GFR calc Af Amer: >60 mL/min (ref >60)
GLUCOSE: 86 mg/dL (ref 65–99)
Potassium: 4.1 mmol/L (ref 3.5–5.1)
Sodium: 132 mmol/L — ABNORMAL LOW (ref 135–145)
TOTAL PROTEIN: 6.7 g/dL (ref 6.5–8.1)

## 2016-10-17 LAB — IRON AND TIBC
Iron: 59 ug/dL (ref 45–182)
SATURATION RATIOS: 25 % (ref 17.9–39.5)
TIBC: 232 ug/dL — AB (ref 250–450)
UIBC: 173 ug/dL

## 2016-10-17 LAB — VITAMIN B12: Vitamin B-12: 706 pg/mL (ref 180–914)

## 2016-10-17 LAB — FERRITIN: Ferritin: 494 ng/mL — ABNORMAL HIGH (ref 24–336)

## 2016-10-17 LAB — RETICULOCYTES
RBC.: 3.24 MIL/uL — AB (ref 4.22–5.81)
RETIC CT PCT: 2.9 % (ref 0.4–3.1)
Retic Count, Absolute: 94 10*3/uL (ref 19.0–186.0)

## 2016-10-17 LAB — FOLATE: Folate: 7.1 ng/mL (ref >5.9)

## 2016-10-17 MED ORDER — NALOXONE HCL 0.4 MG/ML IJ SOLN
INTRAMUSCULAR | Status: DC
Start: 2016-10-17 — End: 2016-10-17
  Filled 2016-10-17: qty 1

## 2016-10-17 MED ORDER — MIDAZOLAM HCL 2 MG/2ML IJ SOLN
INTRAMUSCULAR | Status: AC | PRN
  Administered 2016-10-17: 0.5 mg via INTRAVENOUS
  Administered 2016-10-17: 1 mg via INTRAVENOUS
  Administered 2016-10-17: 0.5 mg via INTRAVENOUS

## 2016-10-17 MED ORDER — NICOTINE 14 MG/24HR TD PT24
14.0000 mg | MEDICATED_PATCH | Freq: Every day | TRANSDERMAL | Status: DC
  Administered 2016-10-17: 14 mg via TRANSDERMAL
  Filled 2016-10-17 (×2): qty 1

## 2016-10-17 MED ORDER — FLUMAZENIL 1 MG/10ML IV SOLN
INTRAVENOUS | Status: AC
  Filled 2016-10-17: qty 10

## 2016-10-17 MED ORDER — GELATIN ABSORBABLE 12-7 MM EX MISC
CUTANEOUS | Status: AC
  Filled 2016-10-17: qty 1

## 2016-10-17 MED ORDER — LIDOCAINE HCL 1 % IJ SOLN
INTRAMUSCULAR | Status: AC
  Filled 2016-10-17: qty 20

## 2016-10-17 MED ORDER — ONDANSETRON HCL 4 MG/2ML IJ SOLN
INTRAMUSCULAR | Status: AC | PRN
  Administered 2016-10-17: 4 mg via INTRAVENOUS

## 2016-10-17 MED ORDER — ONDANSETRON HCL 4 MG/2ML IJ SOLN
INTRAMUSCULAR | Status: DC
Start: 2016-10-17 — End: 2016-10-18
  Filled 2016-10-17: qty 2

## 2016-10-17 MED ORDER — MIDAZOLAM HCL 2 MG/2ML IJ SOLN
INTRAMUSCULAR | Status: DC
Start: 2016-10-17 — End: 2016-10-18
  Filled 2016-10-17: qty 4

## 2016-10-17 MED ORDER — FENTANYL CITRATE (PF) 100 MCG/2ML IJ SOLN
INTRAMUSCULAR | Status: AC | PRN
  Administered 2016-10-17: 25 ug via INTRAVENOUS
  Administered 2016-10-17: 50 ug via INTRAVENOUS
  Administered 2016-10-17: 25 ug via INTRAVENOUS

## 2016-10-17 MED ORDER — FENTANYL CITRATE (PF) 100 MCG/2ML IJ SOLN
INTRAMUSCULAR | Status: AC
  Filled 2016-10-17: qty 4

## 2016-10-17 NOTE — Consult Note (Signed)
Chief Complaint: Patient was seen in consultation today for liver lesion biopsy at the request of Dr. Ancil Linsey  Referring Physician(s): Dr. Ancil Linsey  Supervising Physician: Marybelle Killings  Patient Status: [facility] - In-pt  History of Present Illness: Micheal Kim is a 60 y.o. male  Patient with history of liver cancer diagnosed last month via radiology presented to ED with acute exacerbation of abdominal pain at the request of Dr. Whitney Muse for timely assessment of liver lesions and possible metastases.   MRI 10/12/2016 IMPRESSION: 1. Innumerable liver masses are identified throughout both lobes of liver. 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 Differential considerations include diffuse liver metastases from unknown primary. 2. Enhancing lesion within left adrenal gland does not have imaging features of a benign adenoma. This is a new finding from 2015 and is worrisome for metastatic disease.  Scheduled now for liver lesion biopsy per Dr. Whitney Muse   Past Medical History:  Diagnosis Date  . Cancer [facility])    liver cancer  . Heartburn     Past Surgical History:  Procedure Laterality Date  . ANKLE FRACTURE SURGERY Right    x2    Allergies: Patient has no known allergies.  Medications: Prior to Admission medications   Medication Sig Start Date End Date Taking? Authorizing Provider  calcium carbonate (TUMS - DOSED IN MG ELEMENTAL CALCIUM) 500 MG chewable tablet Chew 10 tablets by mouth daily as needed for indigestion or heartburn.    Historical Provider, MD  esomeprazole (NEXIUM 24HR) 20 MG capsule Take 20 mg by mouth daily at 12 noon.    Historical Provider, MD  omeprazole (PRILOSEC) 40 MG capsule Take 1 capsule (40 mg total) by mouth daily. 10/16/16   Patrici Ranks, MD  oxyCODONE 10 MG TABS Take 1 tablet (10 mg total) by mouth every 4 (four) hours as needed for severe pain. 10/16/16   Patrici Ranks, MD       Family History  Problem Relation Age of Onset  . Colon cancer Mother   . Heart attack Father     Social History   Social History  . Marital status: Single    Spouse name: N/A  . Number of children: N/A  . Years of education: N/A   Social History Main Topics  . Smoking status: Current Every Day Smoker    Types: Cigarettes  . Smokeless tobacco: Never Used     Comment: started smoking age 40  . Alcohol use No     Comment: quit driking 10/04/16-drank 10 beers and /or 5th od liquer daily  . Drug use:     Types: Marijuana     Comment: states he has 'done it all' but quit "year ago"  . Sexual activity: Not Currently     Comment: single   Other Topics Concern  . None   Social History Narrative  . None    Review of Systems: A 12 point ROS discussed and pertinent positives are indicated in the HPI above.  All other systems are negative.  Review of Systems  Constitutional: Positive for unexpected weight change.  Respiratory: Negative for cough and shortness of breath.   Cardiovascular: Negative for chest pain.  Gastrointestinal: Positive for abdominal distention and abdominal pain.  Psychiatric/Behavioral: Negative for behavioral problems and confusion.    Vital Signs: BP 132/87 (BP Location: Left Arm)   Pulse 98   Resp 18   SpO2 97%   Physical Exam  Constitutional: He is  oriented to person, place, and time. He appears well-developed and well-nourished.  Cardiovascular: Normal rate and regular rhythm.   Pulmonary/Chest: Effort normal and breath sounds normal. No respiratory distress.  Abdominal: He exhibits distension. There is tenderness.  Neurological: He is alert and oriented to person, place, and time.  Skin: Skin is warm and dry.  Psychiatric: He has a normal mood and affect. His behavior is normal. Judgment and thought content normal.  Nursing note and vitals reviewed.   Mallampati Score:  MD Evaluation Airway: WNL Heart: WNL Abdomen: WNL Chest/  Lungs: WNL ASA  Classification: 3 Mallampati/Airway Score: One  Imaging: Ct Chest Wo Contrast  Result Date: 10/13/2016 CLINICAL DATA:  History of liver cancer. EXAM: CT CHEST WITHOUT CONTRAST TECHNIQUE: Multidetector CT imaging of the chest was performed following the standard protocol without IV contrast. COMPARISON:  None FINDINGS: Cardiovascular: The heart size appears normal. No pericardial effusion. Aortic atherosclerosis noted. Calcification in the LAD coronary artery noted. Mediastinum/Nodes: The trachea appears patent and is midline. Normal appearance of the esophagus. There is no mediastinal or hilar adenopathy. Left lobe of thyroid gland is absent or atrophic. No mediastinal or hilar adenopathy. No axillary or supraclavicular adenopathy. Lungs/Pleura: There is no pleural fluid identified. Moderate changes of paraseptal and centrilobular emphysema identified. Within the right lung apex there is a part solid nodule which measures 1.4 cm, image 25 of series 3. There is a subpleural nodule which overlies the posterior medial right lower lobe. This measures 6 mm, image 69 of series 3. Upper Abdomen: Cirrhotic appearing liver containing multiple masses are identified. See report from MRI dated 10/12/2016 for more details. 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 There are no aggressive lytic or sclerotic bone lesions. IMPRESSION: 1. No acute cardiopulmonary abnormalities. 2. There is a sub solid nodule within the right upper lobe measuring 14 mm. Follow-up non-contrast CT recommended at 3-6 months to confirm persistence. If unchanged, and solid component remains <6 mm, annual CT is recommended until 5 years of stability has been established. If persistent these nodules should be considered highly suspicious if the solid component of the nodule is 6 mm or greater in size and enlarging. This recommendation follows the consensus statement: Guidelines for Management  of Incidental Pulmonary Nodules Detected on CT Images: From the Fleischner Society 2017; Radiology 2017; 284:228-243. 3. Single solid nodule within the subpleural right lower lobe measures 6 mm. Non-contrast chest CT at 6-12 months is recommended. If the nodule is stable at time of repeat CT, then future CT at 18-24 months (from today's scan) is recommended for high-risk patients. This recommendation follows the consensus statement: Guidelines for Management of Incidental Pulmonary Nodules Detected on CT Images:From the Fleischner Society 2017; published online before print (10.1148/radiol.SG:5268862). 4. Aortic atherosclerosis and coronary artery calcification 5. Emphysema. Electronically Signed   By: Kerby Moors M.D.   On: 10/13/2016 09:23   Mr Abdomen W Wo Contrast  Result Date: 10/13/2016 CLINICAL DATA:  Evaluate liver lesions. EXAM: MRI ABDOMEN WITHOUT AND WITH CONTRAST TECHNIQUE: Multiplanar multisequence MR imaging of the abdomen was performed both before and after the administration of intravenous contrast. CONTRAST:  19mL MULTIHANCE GADOBENATE DIMEGLUMINE 529 MG/ML IV SOLN COMPARISON:  08/17/14 FINDINGS: Lower chest: No acute findings. Hepatobiliary: The liver is enlarged. There are innumerable masses of varying size throughout both lobes of the liver worrisome for multifocal about a cellular carcinoma versus diffuse metastatic disease. Index lesion within posterior right lobe of liver measures 2.7 cm, image 19  of series 21. Index lesion within the lateral segment of left lobe of liver measures 3.5 cm, image 60 of series 5009 lesion within the medial segment of left lobe of liver measures 2.3 cm, image 35 of series 5009. Large exophytic lesion arising from the far lateral aspect of the left lobe of liver measures 5.4 cm, image 22 of series 5009. Pancreas: No mass, inflammatory changes, or other parenchymal abnormality identified. Spleen:  Within normal limits in size and appearance. Adrenals/Urinary  Tract: The right adrenal gland appears normal. There is a solid enhancing nodule in the left adrenal gland which measures 1.3 cm, image 30 of series 5005. Hemorrhagic cyst in the right kidney measures 6 mm. Unremarkable appearance of the left kidney. Several simple appearing cysts are identified within the inferior pole of the left kidney. No mass or hydronephrosis noted. Stomach/Bowel: Visualized portions within the abdomen are unremarkable. Vascular/Lymphatic: No pathologically enlarged lymph nodes identified. No abdominal aortic aneurysm demonstrated. Other:  There is a small volume of perihepatic ascites. Musculoskeletal: No suspicious bone lesions identified. IMPRESSION: 1. Innumerable liver masses are identified throughout both lobes of liver. 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 Differential considerations include diffuse liver metastases from unknown primary. 2. Enhancing lesion within left adrenal gland does not have imaging features of a benign adenoma. This is a new finding from 2015 and is worrisome for metastatic disease. Electronically Signed   By: Kerby Moors M.D.   On: 10/13/2016 10:24    Labs:  CBC:  Recent Labs  10/16/16 1731 10/16/16 2320  WBC 13.2* 12.2*  HGB 11.4* 10.7*  HCT 33.6* 31.1*  PLT 731* 620*    COAGS:  Recent Labs  10/16/16 1731  INR 0.90    BMP:  Recent Labs  10/16/16 1731 10/17/16 0556  NA 134* 132*  K 4.4 4.1  CL 101 102  CO2 27 24  GLUCOSE 98 86  BUN 14 14  CALCIUM 9.0 8.4*  CREATININE 0.81 0.61  GFRNONAA >60 >60  GFRAA >60 >60    LIVER FUNCTION TESTS:  Recent Labs  10/16/16 1731 10/17/16 0556  BILITOT 1.8* 2.0*  AST 249* 200*  ALT 215* 175*  ALKPHOS 632* 541*  PROT 7.6 6.7  ALBUMIN 2.6* 2.2*    TUMOR MARKERS: No results for input(s): AFPTM, CEA, CA199, CHROMGRNA in the last 8760 hours.  Assessment and Plan:  Multiple liver masses identified on MR of the abdomen  (11/9) Patient presents for liver lesion biopsy at the request of Dr. Whitney Muse. Risks and Benefits discussed with the patient including, but not limited to bleeding, infection, damage to adjacent structures or low yield requiring additional tests. All of the patient's questions were answered, patient is agreeable to proceed. Consent signed and in chart.    Thank you for this interesting consult.  I greatly enjoyed meeting Deshae Jagielski and look forward to participating in their care.  A copy of this report was sent to the requesting provider on this date.  Electronically Signed: Monia Sabal A 10/17/2016, 1:42 PM   I spent a total of 40 Minutes  in face to face in clinical consultation, greater than 50% of which was counseling/coordinating care for liver lesion biopsy.

## 2016-10-17 NOTE — Treatment Plan (Signed)
Patient re-assessed after return from biopsy. Patient with some residual post-procedural soreness. As it is later in evening and pt is complaining of mild abd soreness,, will hold d/c until tomorrow AM.

## 2016-10-17 NOTE — Progress Notes (Signed)
Patient ID: Micheal Kim, male   DOB: 08-Sep-1956, 60 y.o.   MRN: [id number]  Patient scheduled for [facility] Radiology procedure today (11/14). See orders. Will return to [facility] after procedure.

## 2016-10-17 NOTE — Sedation Documentation (Signed)
Patient discharged to [facility] via [facility]. Report given to [facility]

## 2016-10-17 NOTE — Discharge Summary (Signed)
Physician Discharge Summary  Micheal Kim [id number] DOB: May 29, 1956 DOA: 10/16/2016  PCP: No PCP Per Patient  Admit date: 10/16/2016 Discharge date: 10/17/2016  Admitted From: Home Disposition:  Home  Recommendations for Outpatient Follow-up:  1. Follow up with PCP in 1-2 weeks 2. Follow up with Oncology as scheduled  Discharge Condition:Stable CODE STATUS:Full Diet recommendation: Regular   Brief/Interim Summary: 60 y.o. male with medical history significant of liver cancer who is coming to the emergency department referred by the oncology clinic for treatment of abdominal pain and possible biopsy in the morning if possible for liver metastases.    Intractable abdominal pain Patient continued on gentle IV hydration. Patient was continued on morphine 4 mg IVP every 2 hours when necessary. While admitted, patient was continued on Zofran 4 mg IVP every 6 hours as needed. Patient continued on famotidine 20 mg IVP every 12 hours. 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 Remained stable this admission    Leukocytosis Denies fever or chills.  Denies productive cough or dysuria. No signs of active infection. Suspect secondary to metastatic disease  Discharge Diagnoses:  Principal Problem:   Intractable abdominal pain Active Problems:   Liver masses   Abnormal LFTs (liver function tests)   Anemia   Thrombocytosis ([facility])   Leukocytosis    Discharge Instructions     Medication List    TAKE these medications   calcium carbonate 500 MG chewable tablet Commonly known as:  TUMS - dosed in mg elemental calcium Chew 10 tablets by mouth daily as needed for indigestion or heartburn.   NEXIUM 24HR 20 MG  capsule Generic drug:  esomeprazole Take 20 mg by mouth daily at 12 noon.   omeprazole 40 MG capsule Commonly known as:  PRILOSEC Take 1 capsule (40 mg total) by mouth daily.   Oxycodone HCl 10 MG Tabs Take 1 tablet (10 mg total) by mouth every 4 (four) hours as needed for severe pain.      Follow-up Information    Follow up with your PCP in 1-2 weeks Follow up.        Molli Hazard, MD. Schedule an appointment as soon as possible for a visit.   Specialties:  Hematology and Oncology, Oncology Contact information: [place] [address] [phone]          No Known Allergies   Procedures/Studies: Ct Chest Wo Contrast  Result Date: 10/13/2016 CLINICAL DATA:  History of liver cancer. EXAM: CT CHEST WITHOUT CONTRAST TECHNIQUE: Multidetector CT imaging of the chest was performed following the standard protocol without IV contrast. COMPARISON:  None FINDINGS: Cardiovascular: The heart size appears normal. No pericardial effusion. Aortic atherosclerosis noted. Calcification in the LAD coronary artery noted. Mediastinum/Nodes: The trachea appears patent and is midline. Normal appearance of the esophagus. There is no mediastinal or hilar adenopathy. Left lobe of thyroid gland is absent or atrophic. No mediastinal or hilar adenopathy. No axillary or supraclavicular adenopathy. Lungs/Pleura: There is no pleural fluid identified. Moderate changes of paraseptal and centrilobular emphysema identified. Within the right lung apex there is a part solid nodule which measures 1.4 cm, image 25 of series 3. There is a subpleural nodule which overlies the posterior medial right lower lobe. This measures 6 mm, image 69 of  series 3. Upper Abdomen: Cirrhotic appearing liver containing multiple masses are identified. See report from MRI dated 10/12/2016 for more details. 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 There are no aggressive lytic  or sclerotic bone lesions. IMPRESSION: 1. No acute cardiopulmonary abnormalities. 2. There is a sub solid nodule within the right upper lobe measuring 14 mm. Follow-up non-contrast CT recommended at 3-6 months to confirm persistence. If unchanged, and solid component remains <6 mm, annual CT is recommended until 5 years of stability has been established. If persistent these nodules should be considered highly suspicious if the solid component of the nodule is 6 mm or greater in size and enlarging. This recommendation follows the consensus statement: Guidelines for Management of Incidental Pulmonary Nodules Detected on CT Images: From the Fleischner Society 2017; Radiology 2017; 284:228-243. 3. Single solid nodule within the subpleural right lower lobe measures 6 mm. Non-contrast chest CT at 6-12 months is recommended. If the nodule is stable at time of repeat CT, then future CT at 18-24 months (from today's scan) is recommended for high-risk patients. This recommendation follows the consensus statement: Guidelines for Management of Incidental Pulmonary Nodules Detected on CT Images:From the Fleischner Society 2017; published online before print (10.1148/radiol.SG:5268862). 4. Aortic atherosclerosis and coronary artery calcification 5. Emphysema. Electronically Signed   By: Kerby Moors M.D.   On: 10/13/2016 09:23   Mr Abdomen W Wo Contrast  Result Date: 10/13/2016 CLINICAL DATA:  Evaluate liver lesions. EXAM: MRI ABDOMEN WITHOUT AND WITH CONTRAST TECHNIQUE: Multiplanar multisequence MR imaging of the abdomen was performed both before and after the administration of intravenous contrast. CONTRAST:  42mL MULTIHANCE GADOBENATE DIMEGLUMINE 529 MG/ML IV SOLN COMPARISON:  08/17/14 FINDINGS: Lower chest: No acute findings. Hepatobiliary: The liver is enlarged. There are innumerable masses of varying size throughout both lobes of the liver worrisome for multifocal about a cellular carcinoma versus diffuse metastatic  disease. Index lesion within posterior right lobe of liver measures 2.7 cm, image 19 of series 21. Index lesion within the lateral segment of left lobe of liver measures 3.5 cm, image 60 of series 5009 lesion within the medial segment of left lobe of liver measures 2.3 cm, image 35 of series 5009. Large exophytic lesion arising from the far lateral aspect of the left lobe of liver measures 5.4 cm, image 22 of series 5009. Pancreas: No mass, inflammatory changes, or other parenchymal abnormality identified. Spleen:  Within normal limits in size and appearance. Adrenals/Urinary Tract: The right adrenal gland appears normal. There is a solid enhancing nodule in the left adrenal gland which measures 1.3 cm, image 30 of series 5005. Hemorrhagic cyst in the right kidney measures 6 mm. Unremarkable appearance of the left kidney. Several simple appearing cysts are identified within the inferior pole of the left kidney. No mass or hydronephrosis noted. Stomach/Bowel: Visualized portions within the abdomen are unremarkable. Vascular/Lymphatic: No pathologically enlarged lymph nodes identified. No abdominal aortic aneurysm demonstrated. Other:  There is a small volume of perihepatic ascites. Musculoskeletal: No suspicious bone lesions identified. IMPRESSION: 1. Innumerable liver masses are identified throughout both lobes of liver. 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 Differential considerations include diffuse liver metastases from unknown primary. 2. Enhancing lesion within left adrenal gland does not have imaging features of a benign adenoma. This is a new finding from 2015 and is worrisome for metastatic disease. Electronically Signed   By: Kerby Moors M.D.   On: 10/13/2016 10:24    Subjective:  Eager to go home  Discharge Exam: Vitals:   10/17/16 0500 10/17/16 1220  BP: (!) 147/79 (!) 142/90  Pulse: (!) 103 96  Resp: (!) 21 20  Temp: 98.3 F (36.8 C) 98.6 F  (37 C)   Vitals:   10/16/16 2300 10/17/16 0000 10/17/16 0500 10/17/16 1220  BP: 132/77  (!) 147/79 (!) 142/90  Pulse: 98  (!) 103 96  Resp: 20  (!) 21 20  Temp: 97.7 F (36.5 C)  98.3 F (36.8 C) 98.6 F (37 C)  TempSrc: Oral  Oral   SpO2: 98%  97% 96%  Weight:  63 kg (138 lb 14.2 oz)    Height:  5' 6.26" (1.683 m)      General: Pt is alert, awake, not in acute distress Cardiovascular: RRR, S1/S2 +, no rubs, no gallops Respiratory: CTA bilaterally, no wheezing, no rhonchi Abdominal: mildly distended, pos BS, generally tender Extremities: no edema, no cyanosis   The results of significant diagnostics from this hospitalization (including imaging, microbiology, ancillary and laboratory) are listed below for reference.     Microbiology: No results found for this or any previous visit (from the past 240 hour(s)).   Labs: BNP (last 3 results) No results for input(s): BNP in the last 8760 hours. Basic Metabolic Panel:  Recent Labs Lab 10/16/16 1731 10/17/16 0556  NA 134* 132*  K 4.4 4.1  CL 101 102  CO2 27 24  GLUCOSE 98 86  BUN 14 14  CREATININE 0.81 0.61  CALCIUM 9.0 8.4*   Liver Function Tests:  Recent Labs Lab 10/16/16 1731 10/17/16 0556  AST 249* 200*  ALT 215* 175*  ALKPHOS 632* 541*  BILITOT 1.8* 2.0*  PROT 7.6 6.7  ALBUMIN 2.6* 2.2*    Recent Labs Lab 10/16/16 1731  LIPASE 27   No results for input(s): AMMONIA in the last 168 hours. CBC:  Recent Labs Lab 10/16/16 1731 10/16/16 2320  WBC 13.2* 12.2*  NEUTROABS 9.2* 7.5  HGB 11.4* 10.7*  HCT 33.6* 31.1*  MCV 94.6 94.5  PLT 731* 620*   Cardiac Enzymes: No results for input(s): CKTOTAL, CKMB, CKMBINDEX, TROPONINI in the last 168 hours. BNP: Invalid input(s): POCBNP CBG: No results for input(s): GLUCAP in the last 168 hours. D-Dimer No results for input(s): DDIMER in the last 72 hours. Hgb A1c No results for input(s): HGBA1C in the last 72 hours. Lipid Profile No results for  input(s): CHOL, HDL, LDLCALC, TRIG, CHOLHDL, LDLDIRECT in the last 72 hours. Thyroid function studies No results for input(s): TSH, T4TOTAL, T3FREE, THYROIDAB in the last 72 hours.  Invalid input(s): FREET3 Anemia work up  [employer]  10/17/16 0556  RETICCTPCT 2.9   Urinalysis No results found for: COLORURINE, APPEARANCEUR, LABSPEC, [place], GLUCOSEU, HGBUR, BILIRUBINUR, KETONESUR, PROTEINUR, UROBILINOGEN, NITRITE, LEUKOCYTESUR Sepsis Labs Invalid input(s): PROCALCITONIN,  WBC,  LACTICIDVEN Microbiology No results found for this or any previous visit (from the past 240 hour(s)).   SIGNED:   Donne Hazel, MD  [facility] 10/17/2016, 1:04 PM  If 7PM-7AM, please contact night-coverage [email or web address] Password [email or web address]

## 2016-10-18 DIAGNOSIS — R109 Unspecified abdominal pain: Secondary | ICD-10-CM | POA: Diagnosis not present

## 2016-10-18 LAB — CBC
HEMATOCRIT: 33.5 % — AB (ref 39.0–52.0)
Hemoglobin: 11.2 g/dL — ABNORMAL LOW (ref 13.0–17.0)
MCH: 32.2 pg (ref 26.0–34.0)
MCHC: 33.4 g/dL (ref 30.0–36.0)
MCV: 96.3 fL (ref 78.0–100.0)
Platelets: 766 10*3/uL — ABNORMAL HIGH (ref 150–400)
RBC: 3.48 MIL/uL — ABNORMAL LOW (ref 4.22–5.81)
RDW: 16.7 % — AB (ref 11.5–15.5)
WBC: 15.4 10*3/uL — ABNORMAL HIGH (ref 4.0–10.5)

## 2016-10-18 LAB — COMPREHENSIVE METABOLIC PANEL
ALBUMIN: 2.4 g/dL — AB (ref 3.5–5.0)
ALK PHOS: 566 U/L — AB (ref 38–126)
ALT: 177 U/L — ABNORMAL HIGH (ref 17–63)
AST: 214 U/L — AB (ref 15–41)
Anion gap: 7 (ref 5–15)
BILIRUBIN TOTAL: 1.9 mg/dL — AB (ref 0.3–1.2)
BUN: 13 mg/dL (ref 6–20)
CALCIUM: 8.7 mg/dL — AB (ref 8.9–10.3)
CO2: 23 mmol/L (ref 22–32)
Chloride: 102 mmol/L (ref 101–111)
Creatinine, Ser: 0.7 mg/dL (ref 0.61–1.24)
GFR calc Af Amer: >60 mL/min (ref >60)
GFR calc non Af Amer: >60 mL/min (ref >60)
GLUCOSE: 108 mg/dL — AB (ref 65–99)
POTASSIUM: 4.1 mmol/L (ref 3.5–5.1)
Sodium: 132 mmol/L — ABNORMAL LOW (ref 135–145)
TOTAL PROTEIN: 7.3 g/dL (ref 6.5–8.1)

## 2016-10-18 LAB — HEPATITIS B SURFACE ANTIBODY, QUANTITATIVE: Hepatitis B-Post: <3.1 m[IU]/mL — ABNORMAL LOW (ref >9.9)

## 2016-10-18 LAB — AFP TUMOR MARKER: AFP TUMOR MARKER: 165.2 ng/mL — AB (ref 0.0–8.3)

## 2016-10-18 LAB — HEPATITIS C ANTIBODY: HCV Ab: >11 s/co ratio — ABNORMAL HIGH (ref 0.0–0.9)

## 2016-10-18 NOTE — Discharge Summary (Signed)
Physician Discharge Summary  Micheal Kim [id number] DOB: 02/20/1956 DOA: 10/16/2016  PCP: No PCP Per Patient  Admit date: 10/16/2016 Discharge date: 10/18/2016  Time spent: 45 minutes  Recommendations for Outpatient Follow-up:  -Will be discharged home today. -Advised to follow up with oncology for biopsy results and treatment plan.   Discharge Diagnoses:  Principal Problem:   Intractable abdominal pain Active Problems:   Hepatic lesion   Abnormal LFTs (liver function tests)   Anemia   Thrombocytosis (HCC)   Leukocytosis   Hepatocellular carcinoma (HCC)   Discharge Condition: Stable and improved  Filed Weights   10/17/16 0000  Weight: 63 kg (138 lb 14.2 oz)    History of present illness:  As per Dr. Olevia Bowens on 11/13:  Micheal Kim is a 60 y.o. male with medical history significant of liver cancer who is coming to the emergency department referred by the oncology clinic for treatment of abdominal pain and possible biopsy in the morning if possible for liver metastases.  Per patient, he started having abdominal pain about a year ago. Since then he states that he has lost about 15-20 pounds of weight. However, he states that lately his abdominal pain has increased in intensity. He complains of occasional nausea, but denies recent emesis, diarrhea or constipation. He denies chest pain, dyspnea, palpitations, pitting edema of the lower extremities, PND orthopnea. He denies GU symptoms.  Hospital Course:   Intractable abdominal pain Patient continued on gentle IV hydration. Patient was continued on morphine 4 mg IVP every 2 hours when necessary. While admitted, patient was continued on Zofran 4 mg IVP every 6 hours as needed. Patient continued on famotidine 20 mg IVP every 12 hours. Patient was prescribed oxycodone on 11/13 per Oncology, not yet filled Pain is much improved  Liver masses Abnormal LFTs likely secondary to liver mets Follow-up LFTs  remained stable S/p biopsy, results pending at time of DC  Anemia Normocytic Likely secondary to chronic disease/metastatic disease  Thrombocytosis ([place]) Secondary to liver metastases. Remained stable this admission  Leukocytosis Denies fever or chills.  Denies productive cough or dysuria. No signs of active infection. Suspect secondary to metastatic disease  Procedures:  Liver biopsy 11/14    Consultations:  IR  Discharge Instructions  Discharge Instructions    Increase activity slowly    Complete by:  As directed        Medication List    TAKE these medications   calcium carbonate 500 MG chewable tablet Commonly known as:  TUMS - dosed in mg elemental calcium Chew 10 tablets by mouth daily as needed for indigestion or heartburn.   NEXIUM 24HR 20 MG capsule Generic drug:  esomeprazole Take 20 mg by mouth daily at 12 noon.   omeprazole 40 MG capsule Commonly known as:  PRILOSEC Take 1 capsule (40 mg total) by mouth daily.   Oxycodone HCl 10 MG Tabs Take 1 tablet (10 mg total) by mouth every 4 (four) hours as needed for severe pain.      No Known Allergies Follow-up Information    Follow up with your PCP in 1-2 weeks Follow up.        Molli Hazard, MD. Schedule an appointment as soon as possible for a visit.   Specialties:  Hematology and Oncology, Oncology Contact information: [address] [place] [address] [phone]            The results of significant diagnostics from this hospitalization (including imaging, microbiology, ancillary and laboratory)  are listed below for reference.    Significant Diagnostic Studies: Ct Chest Wo Contrast  Result Date: 10/13/2016 CLINICAL DATA:  History of liver cancer. EXAM: CT CHEST WITHOUT CONTRAST TECHNIQUE: Multidetector CT imaging of the chest was performed following the standard protocol without IV contrast. COMPARISON:  None FINDINGS: Cardiovascular: The heart size appears  normal. No pericardial effusion. Aortic atherosclerosis noted. Calcification in the LAD coronary artery noted. Mediastinum/Nodes: The trachea appears patent and is midline. Normal appearance of the esophagus. There is no mediastinal or hilar adenopathy. Left lobe of thyroid gland is absent or atrophic. No mediastinal or hilar adenopathy. No axillary or supraclavicular adenopathy. Lungs/Pleura: There is no pleural fluid identified. Moderate changes of paraseptal and centrilobular emphysema identified. Within the right lung apex there is a part solid nodule which measures 1.4 cm, image 25 of series 3. There is a subpleural nodule which overlies the posterior medial right lower lobe. This measures 6 mm, image 69 of series 3. Upper Abdomen: Cirrhotic appearing liver containing multiple masses are identified. See report from MRI dated 10/12/2016 for more details. 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 There are no aggressive lytic or sclerotic bone lesions. IMPRESSION: 1. No acute cardiopulmonary abnormalities. 2. There is a sub solid nodule within the right upper lobe measuring 14 mm. Follow-up non-contrast CT recommended at 3-6 months to confirm persistence. If unchanged, and solid component remains <6 mm, annual CT is recommended until 5 years of stability has been established. If persistent these nodules should be considered highly suspicious if the solid component of the nodule is 6 mm or greater in size and enlarging. This recommendation follows the consensus statement: Guidelines for Management of Incidental Pulmonary Nodules Detected on CT Images: From the Fleischner Society 2017; Radiology 2017; 284:228-243. 3. Single solid nodule within the subpleural right lower lobe measures 6 mm. Non-contrast chest CT at 6-12 months is recommended. If the nodule is stable at time of repeat CT, then future CT at 18-24 months (from today's scan) is recommended for high-risk  patients. This recommendation follows the consensus statement: Guidelines for Management of Incidental Pulmonary Nodules Detected on CT Images:From the Fleischner Society 2017; published online before print (10.1148/radiol.SG:5268862). 4. Aortic atherosclerosis and coronary artery calcification 5. Emphysema. Electronically Signed   By: Kerby Moors M.D.   On: 10/13/2016 09:23   Mr Abdomen W Wo Contrast  Result Date: 10/13/2016 CLINICAL DATA:  Evaluate liver lesions. EXAM: MRI ABDOMEN WITHOUT AND WITH CONTRAST TECHNIQUE: Multiplanar multisequence MR imaging of the abdomen was performed both before and after the administration of intravenous contrast. CONTRAST:  61mL MULTIHANCE GADOBENATE DIMEGLUMINE 529 MG/ML IV SOLN COMPARISON:  08/17/14 FINDINGS: Lower chest: No acute findings. Hepatobiliary: The liver is enlarged. There are innumerable masses of varying size throughout both lobes of the liver worrisome for multifocal about a cellular carcinoma versus diffuse metastatic disease. Index lesion within posterior right lobe of liver measures 2.7 cm, image 19 of series 21. Index lesion within the lateral segment of left lobe of liver measures 3.5 cm, image 60 of series 5009 lesion within the medial segment of left lobe of liver measures 2.3 cm, image 35 of series 5009. Large exophytic lesion arising from the far lateral aspect of the left lobe of liver measures 5.4 cm, image 22 of series 5009. Pancreas: No mass, inflammatory changes, or other parenchymal abnormality identified. Spleen:  Within normal limits in size and appearance. Adrenals/Urinary Tract: The right adrenal gland appears normal. There is a solid enhancing  nodule in the left adrenal gland which measures 1.3 cm, image 30 of series 5005. Hemorrhagic cyst in the right kidney measures 6 mm. Unremarkable appearance of the left kidney. Several simple appearing cysts are identified within the inferior pole of the left kidney. No mass or hydronephrosis noted.  Stomach/Bowel: Visualized portions within the abdomen are unremarkable. Vascular/Lymphatic: No pathologically enlarged lymph nodes identified. No abdominal aortic aneurysm demonstrated. Other:  There is a small volume of perihepatic ascites. Musculoskeletal: No suspicious bone lesions identified. IMPRESSION: 1. Innumerable liver masses are identified throughout both lobes of liver. 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 Differential considerations include diffuse liver metastases from unknown primary. 2. Enhancing lesion within left adrenal gland does not have imaging features of a benign adenoma. This is a new finding from 2015 and is worrisome for metastatic disease. Electronically Signed   By: Kerby Moors M.D.   On: 10/13/2016 10:24   US Biopsy  Result Date: 10/17/2016 INDICATION: Liver lesions EXAM: ULTRASOUND-GUIDED BIOPSY OF A LIVER LESION.  CORE. MEDICATIONS: None. ANESTHESIA/SEDATION: Fentanyl 100 mcg IV; Versed 2 mg IV Moderate Sedation Time:  11 The patient was continuously monitored during the procedure by the interventional radiology nurse under my direct supervision. FLUOROSCOPY TIME:  Fluoroscopy Time:  minutes  seconds ( mGy). COMPLICATIONS: None immediate. PROCEDURE: Informed written consent was obtained from the patient after a thorough discussion of the procedural risks, benefits and alternatives. All questions were addressed. Maximal Sterile Barrier Technique was utilized including caps, mask, sterile gowns, sterile gloves, sterile drape, hand hygiene and skin antiseptic. A timeout was performed prior to the initiation of the procedure. The upper abdomen was prepped with ChloraPrep in a sterile fashion, and a sterile drape was applied covering the operative field. A sterile gown and sterile gloves were used for the procedure. Under sonographic guidance, an 17 gauge guide needle was advanced into the left lobe liver lesion. Subsequently 2 18  gauge core biopsies were obtained. The guide needle was removed. Final imaging was performed. Patient tolerated the procedure well without complication. Vital sign monitoring by nursing staff during the procedure will continue as patient is in the special procedures unit for post procedure observation. FINDINGS: The images document guide needle placement within the left lobe liver lesion. Post biopsy images demonstrate no hemorrhage. IMPRESSION: Successful ultrasound-guided core biopsy of a left lobe liver lesion. Electronically Signed   By: Marybelle Killings M.D.   On: 10/17/2016 15:50    Microbiology: No results found for this or any previous visit (from the past 240 hour(s)).   Labs: Basic Metabolic Panel:  Recent Labs Lab 10/16/16 1731 10/17/16 0556 10/18/16 0617  NA 134* 132* 132*  K 4.4 4.1 4.1  CL 101 102 102  CO2 27 24 23   GLUCOSE 98 86 108*  BUN 14 14 13   CREATININE 0.81 0.61 0.70  CALCIUM 9.0 8.4* 8.7*   Liver Function Tests:  Recent Labs Lab 10/16/16 1731 10/17/16 0556 10/18/16 0617  AST 249* 200* 214*  ALT 215* 175* 177*  ALKPHOS 632* 541* 566*  BILITOT 1.8* 2.0* 1.9*  PROT 7.6 6.7 7.3  ALBUMIN 2.6* 2.2* 2.4*    Recent Labs Lab 10/16/16 1731  LIPASE 27   No results for input(s): AMMONIA in the last 168 hours. CBC:  Recent Labs Lab 10/16/16 1731 10/16/16 2320 10/18/16 0617  WBC 13.2* 12.2* 15.4*  NEUTROABS 9.2* 7.5  --   HGB 11.4* 10.7* 11.2*  HCT 33.6* 31.1* 33.5*  MCV  94.6 94.5 96.3  PLT 731* 620* 766*   Cardiac Enzymes: No results for input(s): CKTOTAL, CKMB, CKMBINDEX, TROPONINI in the last 168 hours. BNP: BNP (last 3 results) No results for input(s): BNP in the last 8760 hours.  ProBNP (last 3 results) No results for input(s): PROBNP in the last 8760 hours.  CBG: No results for input(s): GLUCAP in the last 168 hours.     SignedLelon Frohlich  [facility] Pager: [phone] 10/18/2016, 11:16 AM

## 2016-10-18 NOTE — Care Management Note (Signed)
Case Management Note  Patient Details  Name: Micheal Kim MRN: [id number] Date of Birth: 1955-12-21  Expected Discharge Date:        10/18/2016          Expected Discharge Plan:  Home/Self Care  In-House Referral:  Financial Counselor  Discharge planning Services  CM Consult  Post Acute Care Choice:  NA Choice offered to:  NA  DME Arranged:    DME Agency:     HH Arranged:    [place] Agency:     Status of Service:  Completed, signed off  If discussed at [employer] of Stay Meetings, dates discussed:    Additional Comments: Patient discharging today. Chart reviewed. Patient is ind with ADL's. Goes to oncology clinic for management of liver cancer. Financial counselor working with patient as he has no insurance. No CM needs identified.  Betzabeth Derringer, Chauncey [place], RN 10/18/2016, 8:07 AM

## 2016-10-18 NOTE — Progress Notes (Signed)
Initial Nutrition Assessment  DOCUMENTATION CODES:  Not applicable  INTERVENTION:  Education on ideal diet for weight maintenance and cancer treatment  Coupons and handouts titled "Increasing Calories and Protein".   RD to follow outpatient  NUTRITION DIAGNOSIS:  Increased nutrient needs related to cancer and cancer related treatments as evidenced by estimated nutritional requirements for this condition  GOAL:  Patient will meet greater than or equal to 90% of their needs  MONITOR:  PO intake, Labs, Weight trends  REASON FOR ASSESSMENT:  RD discretion    ASSESSMENT:  60 y/o male PMHx Heart Burn and recently diagnosed Liver Cancer. Presented from outpatient appointment at [facility] for treatment of abdominal pain and biopsy of possible liver metastases.   RD asked by oncology to see pt.   Pt reports that his abdominal pain worsens with PO intake. Because of this he eats only small amounts at a time. Additionally, the increased abdominal pressure makes it so he gets full after only "5 bites". He has correctly adapted to eating small amounts frequently throughout the day.   He also has a lifelong problem with heartburn; "I can get heartburn from water". He tries to avoid greasy food because of this. Surprisingly, he has not trouble with other high fat foods such as buttermilk, which he reports a very large intake of, or Ensure.   He has had a 1 month problem with constipation. He had 2 BMs todays and says these were the best he has had in the last month.   For most part he denies N/V. He says he had one occurrence recently, but felt it was more related to what he ate. He had eaten a 22 month old egg; "They looked alright".   He gives a UBW as 150. He says he last weighed 150 lbs 1 year ago, indicating a very gradual weight loss instead of a severe abrupt loss. However, he does have noticeable abdominal swelling that is masking further wt loss.   RD provided education on basics of  nutrition therapy during cancer treatment: minimize side effects w/ diet, prioritize eating high kcal/protein foods as able and eating frequently. RD assessed dietary recall. He is already eating many appropriate foods suck as Pinto beans, whole milk, butter milk, oyster stew with milk added. We discussed high protein kcal foods such as PB, eggs, Milk, cheese. Encouraged him to continue to eat small amounts throughout the day.   Pt has very limited resources. He is educated on Ensure assistance program at [facility]. He will be provided with case of Ensure at his next visit.   NFPE: Unable to assess as pt in street clothes awaiting D/C.   Labs:  Elevated liver enzymes/Bili/alk phos, Albumin: 2.4, WBC:15.4  Medications: Morphine and zofran PRN, Pepcid   Recent Labs Lab 10/16/16 1731 10/17/16 0556 10/18/16 0617  NA 134* 132* 132*  K 4.4 4.1 4.1  CL 101 102 102  CO2 27 24 23   BUN 14 14 13   CREATININE 0.81 0.61 0.70  CALCIUM 9.0 8.4* 8.7*  GLUCOSE 98 86 108*   Diet Order:  Diet regular Room service appropriate? Yes; Fluid consistency: Thin  Skin:  Reviewed, no issues  Last BM:  11/15  Height:  Ht Readings from Last 1 Encounters:  10/17/16 5' 6.26" (1.683 m)   Weight:  Wt Readings from Last 1 Encounters:  10/17/16 138 lb 14.2 oz (63 kg)   Wt Readings from Last 10 Encounters:  10/17/16 138 lb 14.2 oz (63 kg)  10/16/16  139 lb 3.2 oz (63.1 kg)   Ideal Body Weight:  65.25 kg  BMI:  Body mass index is 22.24 kg/m.  Estimated Nutritional Needs:  Kcal:  1900-2100 (30-33 kcal/kg bw) Protein:  82-95 g Pro (1.3-1.5 g/kg bw) Fluid:  >1.9 L (30 ml/kg)  EDUCATION NEEDS:  Education needs addressed  Burtis Junes RD, LDN, CNSC Clinical Nutrition Pager: [phone] 10/18/2016 1:38 PM

## 2016-10-18 NOTE — Progress Notes (Signed)
Discharge instructions read to patient. Patient verbalized understanding of all instructions. Pt discharged with self to home. Pt states he rides the bus.

## 2016-10-24 ENCOUNTER — Encounter: Payer: Self-pay | Admitting: *Deleted

## 2016-10-24 NOTE — Progress Notes (Signed)
[place] Psychosocial Distress Screening Clinical Social Work  Clinical Social Work was referred by distress screening protocol.  The patient scored a 8 on the Psychosocial Distress Thermometer which indicates severe distress. Clinical Social Worker reviewed chart and phoned pt to assess for distress and other psychosocial needs. CSW left brief message explaining role of CSW and left contact information.   ONCBCN DISTRESS SCREENING 10/16/2016  Screening Type Initial Screening  Distress experienced in past week (1-10) 8  Emotional problem type Adjusting to illness  Spiritual/Religous concerns type Facing my mortality  Physical Problem type Pain;Nausea/vomiting;Sleep/insomnia;Breathing;Loss of appetitie;Constipation/diarrhea;Changes in urination  Physician notified of physical symptoms Yes  Referral to clinical psychology No  Referral to clinical social work Yes  Referral to dietition Yes  Referral to financial advocate Yes  Other "no energy, sick all the time", patient has no insurance and transportation issues. Contact number is [phone]    Clinical Social Worker follow up needed: Yes.    If yes, follow up plan: CSW awaits return call and will assist as needed. Loren Racer, [place] Tuesdays   [phone]

## 2016-10-24 NOTE — Progress Notes (Signed)
[place] Work  Clinical Social Work received return call from pt and he reports he is "doing well" and plans to ride [employer] for his appointment tomorrow. Pt feels someone applied for medicaid on his behalf while he was in the hospital. CSW encouraged him to check in on this with DSS. Pt reports he currently receives food stamps, so should get approved. Pt reports financial counselor from the hospital encouraged him to apply for ss disability and CSW reviewed how to apply. Pt reports he has a sister and some friends to assist him as needed. Pt plans to reach out as needed and appreciated call.   Clinical Social Work interventions: Supportive listening Resource education  Loren Racer, [place] Tuesdays   [phone]

## 2016-10-25 ENCOUNTER — Encounter (HOSPITAL_COMMUNITY): Payer: Self-pay | Admitting: Hematology & Oncology

## 2016-10-25 ENCOUNTER — Ambulatory Visit (HOSPITAL_COMMUNITY): Payer: Self-pay | Admitting: Oncology

## 2016-10-25 ENCOUNTER — Ambulatory Visit (HOSPITAL_COMMUNITY): Payer: Self-pay

## 2016-10-25 ENCOUNTER — Encounter (HOSPITAL_COMMUNITY): Payer: Self-pay | Admitting: Lab

## 2016-10-25 ENCOUNTER — Encounter (HOSPITAL_BASED_OUTPATIENT_CLINIC_OR_DEPARTMENT_OTHER): Payer: Medicaid Other | Admitting: Hematology & Oncology

## 2016-10-25 ENCOUNTER — Ambulatory Visit (HOSPITAL_COMMUNITY): Payer: Self-pay | Admitting: Hematology & Oncology

## 2016-10-25 VITALS — BP 133/85 | HR 99 | Temp 97.7°F | Resp 16 | Wt 142.0 lb

## 2016-10-25 DIAGNOSIS — G893 Neoplasm related pain (acute) (chronic): Secondary | ICD-10-CM

## 2016-10-25 DIAGNOSIS — C220 Liver cell carcinoma: Secondary | ICD-10-CM

## 2016-10-25 DIAGNOSIS — F1910 Other psychoactive substance abuse, uncomplicated: Secondary | ICD-10-CM

## 2016-10-25 DIAGNOSIS — F1010 Alcohol abuse, uncomplicated: Secondary | ICD-10-CM

## 2016-10-25 DIAGNOSIS — Z7189 Other specified counseling: Secondary | ICD-10-CM

## 2016-10-25 NOTE — Progress Notes (Unsigned)
Referral to Hospice. Records faxed on 11/22

## 2016-10-25 NOTE — Progress Notes (Signed)
[name]  CONSULT NOTE  Patient Care Team: No Pcp Per Patient as PCP - General (General Practice)  CHIEF COMPLAINTS/PURPOSE OF CONSULTATION:  Extensive hepatic abnormality Substance and alcohol abuse RUQ pain Liver Biopsy 11/14  [name]  HISTORY OF PRESENTING ILLNESS:  Micheal Kim 60 y.o. male is here for ongoing follow-up of imaging findings highly suspicious for [place]. He has undergone biopsy. He presents today to review.   Micheal Kim is a pleasant 60 y.o. who presented to [facility] ED on 10/04/2016 complaining of gradually worsening right flank pain for the past 2 weeks. This pain was exacerbated with breathing and his abdomen was distended. Accordingly a CT Abdomen/Pelvis was performed revealing progression of previously demonstrated masses involving the left hepatic lobe. There was extensive hepatic abnormality, most concerning for multifocal hepatocellular carcinoma in the setting of underlying cirrhosis. Multifocal metastases from an unknown primary less likely. There was also a new small left adrenal nodule, potentially a metastasis. These results were discussed with the patient while in the hospital.   Additional imaging was performed at [facility] including:   10/12/16 MR Abdomen showed innumerable liver masses throughout both lobes of the liver. There was also an enhancing lesion within the left adrenal gland that does not have imaging features of a benign adenoma. This is a new finding from 2015 and is worrisome for metastatic disease.   10/12/16 CT Chest showed a sub solid nodule within the right upper lobe measuring 14 mm. Also seen was a single solid nodule within the subpleural right lower lobe measuring 6 mm.  Patient is unaccompanied today. He has a roommate who he says will be able to take care of him. Micheal Kim also has hepatitis C. He says he can feel his liver enlarged. He needs assistance at times with ADL's. He does not have much  of an appetite. It notes that it is difficult to eat and he thinks it is because his liver is large.   He desires pain control. That is his major wish. He understands his cancer is terminal.   MEDICAL HISTORY:  Past Medical History:  Diagnosis Date  . Cancer [facility])    liver cancer  . Heartburn     SURGICAL HISTORY: Past Surgical History:  Procedure Laterality Date  . ANKLE FRACTURE SURGERY Right    x2    SOCIAL HISTORY: Social History   Social History  . Marital status: Single    Spouse name: N/A  . Number of children: N/A  . Years of education: N/A   Occupational History  . Not on file.   Social History Main Topics  . Smoking status: Current Every Day Smoker    Types: Cigarettes  . Smokeless tobacco: Never Used     Comment: started smoking age 18  . Alcohol use No     Comment: quit driking 10/04/16-drank 10 beers and /or 5th of liquor daily  . Drug use:     Types: Marijuana     Comment: states he has 'done it all' but quit "year ago"  . Sexual activity: Not Currently     Comment: single   Other Topics Concern  . Not on file   Social History Narrative  . No narrative on file  He smokes about 4 cigarettes daily. He quit drinking alcohol the first of the month.  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  He does not drive. He is able to get to [facility] if he has to, stating he can ride the bus. He lives with his son-in-law and an old friend who helps him pay bills  FAMILY HISTORY: Family History  Problem Relation Age of Onset  . Colon cancer Mother   . Heart attack Father     Mother deceased 23 years-old of colon cancer and several different ailments. Father deceased at 34 yars-old of massive heart attack. One sister living about 10 years younger than the patient. They are not that close.  ALLERGIES:  has No Known Allergies.  MEDICATIONS:  Current  Outpatient Prescriptions  Medication Sig Dispense Refill  . calcium carbonate (TUMS - DOSED IN MG ELEMENTAL CALCIUM) 500 MG chewable tablet Chew 10 tablets by mouth daily as needed for indigestion or heartburn.    Marland Kitchen omeprazole (PRILOSEC) 40 MG capsule Take 1 capsule (40 mg total) by mouth daily. 30 capsule 1  . oxyCODONE 10 MG TABS Take 1 tablet (10 mg total) by mouth every 4 (four) hours as needed for severe pain. 90 tablet 0  . esomeprazole (NEXIUM 24HR) 20 MG capsule Take 20 mg by mouth daily at 12 noon.     No current facility-administered medications for this visit.     Review of Systems  Constitutional: Negative.   HENT: Positive for nosebleeds.        Dry mouth and bitter taste in mouth  Eyes: Negative.   Respiratory: Negative.   Cardiovascular: Positive for chest pain.  Gastrointestinal: Positive for abdominal pain and heartburn.       Right flank pain  Genitourinary: Negative.   Musculoskeletal: Negative.   Skin: Negative.   Neurological: Negative.   Endo/Heme/Allergies: Negative.   Psychiatric/Behavioral: Negative.   All other systems reviewed and are negative.  14 point ROS was done and is otherwise as detailed above or in HPI   PHYSICAL EXAMINATION: ECOG PERFORMANCE STATUS: 2 - Symptomatic, <50% confined to bed  Vitals:   10/25/16 1314  BP: 133/85  Pulse: 99  Resp: 16  Temp: 97.7 F (36.5 C)   Filed Weights   10/25/16 1314  Weight: 142 lb (64.4 kg)     Physical Exam  Constitutional: He is oriented to person, place, and time and well-developed, well-nourished, and in no distress.  HENT:  Head: Normocephalic and atraumatic.  Mouth/Throat: Oropharynx is clear and moist.  Eyes: Conjunctivae and EOM are normal. Pupils are equal, round, and reactive to light.  Neck: Normal range of motion. Neck supple.  Cardiovascular: Normal rate, regular rhythm and normal heart sounds.   Pulmonary/Chest: Effort normal and breath sounds normal.  Abdominal: Soft. Bowel  sounds are normal.  Hepatomegaly  Musculoskeletal: Normal range of motion.  Neurological: He is alert and oriented to person, place, and time. Gait normal.  Skin: Skin is warm and dry.  Nursing note and vitals reviewed.   LABORATORY DATA:  I have reviewed the data as listed Lab Results  Component Value Date   WBC 15.4 (H) 10/18/2016   HGB 11.2 (L) 10/18/2016   HCT 33.5 (L) 10/18/2016   MCV 96.3 10/18/2016   PLT 766 (H) 10/18/2016   CMP     Component Value Date/Time   NA 132 (L) 10/18/2016 0617   K 4.1 10/18/2016 0617   CL 102 10/18/2016 0617   CO2 23 10/18/2016 0617   GLUCOSE 108 (H) 10/18/2016 0617   BUN 13 10/18/2016 0617   CREATININE 0.70 10/18/2016  [id number]   CALCIUM 8.7 (L) 10/18/2016 0617   PROT 7.3 10/18/2016 0617   ALBUMIN 2.4 (L) 10/18/2016 0617   AST 214 (H) 10/18/2016 0617   ALT 177 (H) 10/18/2016 0617   ALKPHOS 566 (H) 10/18/2016 0617   BILITOT 1.9 (H) 10/18/2016 0617   GFRNONAA >60 10/18/2016 0617   GFRAA >60 10/18/2016 0617     RADIOGRAPHIC STUDIES: I have personally reviewed the radiological images as listed and agreed with the findings in the report. No results found.  Study Result   INDICATION: Liver lesions  EXAM: ULTRASOUND-GUIDED BIOPSY OF A LIVER LESION.  CORE.  MEDICATIONS: None.  ANESTHESIA/SEDATION: Fentanyl 100 mcg IV; Versed 2 mg IV  Moderate Sedation Time:  11  The patient was continuously monitored during the procedure by the interventional radiology nurse under my direct supervision.  FLUOROSCOPY TIME:  Fluoroscopy Time:  minutes  seconds ( mGy).  COMPLICATIONS: None immediate.  PROCEDURE: Informed written consent was obtained from the patient after a thorough discussion of the procedural risks, benefits and alternatives. All questions were addressed. Maximal Sterile Barrier Technique was utilized including caps, mask, sterile gowns, sterile gloves, sterile drape, hand hygiene and skin antiseptic. A timeout was  performed prior to the initiation of the procedure.  The upper abdomen was prepped with ChloraPrep in a sterile fashion, and a sterile drape was applied covering the operative field. A sterile gown and sterile gloves were used for the procedure.  Under sonographic guidance, an 17 gauge guide needle was advanced into the left lobe liver lesion. Subsequently 2 18 gauge core biopsies were obtained. The guide needle was removed. Final imaging was performed.  Patient tolerated the procedure well without complication. Vital sign monitoring by nursing staff during the procedure will continue as patient is in the special procedures unit for post procedure observation.  FINDINGS: The images document guide needle placement within the left lobe liver lesion. Post biopsy images demonstrate no hemorrhage.  IMPRESSION: Successful ultrasound-guided core biopsy of a left lobe liver lesion.   Electronically Signed   By: Marybelle Killings M.D.   On: 10/17/2016 15:50   Study Result   CLINICAL DATA:  Evaluate liver lesions.  EXAM: MRI ABDOMEN WITHOUT AND WITH CONTRAST  TECHNIQUE: Multiplanar multisequence MR imaging of the abdomen was performed both before and after the administration of intravenous contrast.  CONTRAST:  32mL MULTIHANCE GADOBENATE DIMEGLUMINE 529 MG/ML IV SOLN  COMPARISON:  08/17/14  FINDINGS: Lower chest: No acute findings.  Hepatobiliary: The liver is enlarged. There are innumerable masses of varying size throughout both lobes of the liver worrisome for multifocal about a cellular carcinoma versus diffuse metastatic disease. Index lesion within posterior right lobe of liver measures 2.7 cm, image 19 of series 21. Index lesion within the lateral segment of left lobe of liver measures 3.5 cm, image 60 of series 5009 lesion within the medial segment of left lobe of liver measures 2.3 cm, image 35 of series 5009. Large exophytic lesion arising from the far  lateral aspect of the left lobe of liver measures 5.4 cm, image 22 of series 5009.  Pancreas: No mass, inflammatory changes, or other parenchymal abnormality identified.  Spleen:  Within normal limits in size and appearance.  Adrenals/Urinary Tract: The right adrenal gland appears normal. There is a solid enhancing nodule in the left adrenal gland which measures 1.3 cm, image 30 of series 5005. Hemorrhagic cyst in the right kidney measures 6 mm. Unremarkable appearance of the left kidney. Several simple appearing cysts are  identified within the inferior pole of the left kidney. No mass or hydronephrosis noted.  Stomach/Bowel: Visualized portions within the abdomen are unremarkable.  Vascular/Lymphatic: No pathologically enlarged lymph nodes identified. No abdominal aortic aneurysm demonstrated.  Other:  There is a small volume of perihepatic ascites.  Musculoskeletal: No suspicious bone lesions identified.  IMPRESSION: 1. Innumerable liver masses are identified throughout both lobes of liver. 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 Differential considerations include diffuse liver metastases from unknown primary. 2. Enhancing lesion within left adrenal gland does not have imaging features of a benign adenoma. This is a new finding from 2015 and is worrisome for metastatic disease.   Electronically Signed   By: Kerby Moors M.D.   On: 10/13/2016 10:24    Study Result   CLINICAL DATA:  History of liver cancer.  EXAM: CT CHEST WITHOUT CONTRAST  TECHNIQUE: Multidetector CT imaging of the chest was performed following the standard protocol without IV contrast.  COMPARISON:  None  FINDINGS: Cardiovascular: The heart size appears normal. No pericardial effusion. Aortic atherosclerosis noted. Calcification in the LAD coronary artery noted.  Mediastinum/Nodes: The trachea appears patent and is midline.  Normal appearance of the esophagus. There is no mediastinal or hilar adenopathy. Left lobe of thyroid gland is absent or atrophic. No mediastinal or hilar adenopathy. No axillary or supraclavicular adenopathy.  Lungs/Pleura: There is no pleural fluid identified. Moderate changes of paraseptal and centrilobular emphysema identified. Within the right lung apex there is a part solid nodule which measures 1.4 cm, image 25 of series 3. There is a subpleural nodule which overlies the posterior medial right lower lobe. This measures 6 mm, image 69 of series 3.  Upper Abdomen: Cirrhotic appearing liver containing multiple masses are identified. See report from MRI dated 10/12/2016 for more details.  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 There are no aggressive lytic or sclerotic bone lesions.  IMPRESSION: 1. No acute cardiopulmonary abnormalities. 2. There is a sub solid nodule within the right upper lobe measuring 14 mm. Follow-up non-contrast CT recommended at 3-6 months to confirm persistence. If unchanged, and solid component remains <6 mm, annual CT is recommended until 5 years of stability has been established. If persistent these nodules should be considered highly suspicious if the solid component of the nodule is 6 mm or greater in size and enlarging. This recommendation follows the consensus statement: Guidelines for Management of Incidental Pulmonary Nodules Detected on CT Images: From the Fleischner Society 2017; Radiology 2017; 284:228-243. 3. Single solid nodule within the subpleural right lower lobe measures 6 mm. Non-contrast chest CT at 6-12 months is recommended. If the nodule is stable at time of repeat CT, then future CT at 18-24 months (from today's scan) is recommended for high-risk patients. This recommendation follows the consensus statement: Guidelines for Management of Incidental Pulmonary Nodules Detected on  CT Images:From the Fleischner Society 2017; published online before print (10.1148/radiol.SG:5268862). 4. Aortic atherosclerosis and coronary artery calcification 5. Emphysema.   Electronically Signed   By: Kerby Moors M.D.   On: 10/13/2016 09:23   PATHOLOGY:   ASSESSMENT & PLAN:  Micheal Kim Innumerable liver masses Hepatitis C Substance and alcohol abuse Cancer related pain Abnormal LFT's Goals of care discussion  I reviewed pathology from his liver biopsy with the patient. He has [place]. I have discussed his case with IR, and as I expected, he is not a candidate for liver directed therapy, disease is too extensive. I do  not feel he could tolerate nexavar given his lft's, PS.  We discussed his goals. He wants to remain at home. He has close friends that he lives with who are willing to care for him. He desires to improve his pain. We discussed hospice. He is agreeable to a hospice consultation.  Refer to hospice.  All questions were answered. The patient knows to call the clinic with any problems, questions or concerns.  This document serves as a record of services personally performed by Ancil Linsey, MD. It was created on her behalf by Elmyra Ricks, a trained medical scribe. The creation of this record is based on the scribe's personal observations and the provider's statements to them. This document has been checked and approved by the attending provider.  I have reviewed the above documentation for accuracy and completeness and I agree with the above.  This note was electronically signed.    Molli Hazard, MD  10/25/2016 2:06 PM

## 2016-10-25 NOTE — Patient Instructions (Addendum)
[place] at [facility] Discharge Instructions  RECOMMENDATIONS MADE BY THE CONSULTANT AND ANY TEST RESULTS WILL BE SENT TO YOUR REFERRING PHYSICIAN.  You saw Dr.Penland today. You are being referred to Hospice.  Thank you for choosing [facility] at [facility] to provide your oncology and hematology care.  To afford each patient quality time with our provider, please arrive at least 15 minutes before your scheduled appointment time.   Beginning January 23rd 2017 lab work for the [facility] will be done in the  Main lab at [facility] on 1st floor. If you have a lab appointment with the [facility] please come in thru the  Main Entrance and check in at the main information desk  You need to re-schedule your appointment should you arrive 10 or more minutes late.  We strive to give you quality time with our providers, and arriving late affects you and other patients whose appointments are after yours.  Also, if you no show three or more times for appointments you may be dismissed from the clinic at the providers discretion.     Again, thank you for choosing [facility].  Our hope is that these requests will decrease the amount of time that you wait before being seen by our physicians.       _____________________________________________________________  Should you have questions after your visit to [facility], please contact our office at [phone] between the hours of 8:30 a.m. and 4:30 p.m.  Voicemails left after 4:30 p.m. will not be returned until the following business day.  For prescription refill requests, have your pharmacy contact our office.         Resources For Cancer Patients and their Caregivers ? American Cancer Society: Can assist with transportation, wigs, general needs, runs Look Good Feel Better.        ([phone] ? Cancer Care: Provides financial assistance, online support groups,  medication/co-pay assistance.  1-800-813-HOPE 828-556-7516) ? [place] Assists [place] cancer patients and their families through emotional , educational and financial support.  (501)678-3831 ? [place] DSS Where to apply for food stamps, Medicaid and utility assistance. 352-617-0194 ? [facility]: Transportation to medical appointments. (620) 833-0715 ? Social Security Administration: May apply for disability if have a Stage IV cancer. 7811612352 (360) 606-7452 ? [employer], Disability and Transit Services: Assists with nutrition, care and transit needs. [place] Support Programs: @10RELATIVEDAYS @ > Cancer Support Group  2nd Tuesday of the month 1pm-2pm, Journey Room  > Creative Journey  3rd Tuesday of the month 1130am-1pm, Journey Room  > Look Good Feel Better  1st Wednesday of the month 10am-12 noon, Journey Room (Call [facility] to register [phone])

## 2016-11-01 ENCOUNTER — Other Ambulatory Visit (HOSPITAL_COMMUNITY): Payer: Self-pay | Admitting: Hematology & Oncology

## 2016-11-01 DIAGNOSIS — G893 Neoplasm related pain (acute) (chronic): Secondary | ICD-10-CM

## 2016-11-13 ENCOUNTER — Encounter (HOSPITAL_COMMUNITY): Payer: Self-pay | Admitting: Hematology & Oncology

## 2016-12-04 DEATH — deceased

## 2016-12-30 ENCOUNTER — Encounter (HOSPITAL_COMMUNITY): Payer: Self-pay | Admitting: Hematology & Oncology

## 2017-09-02 IMAGING — US US BIOPSY
1 series · 13 of 22 positions shown · non-contrast
Comparison: none

INDICATION: Liver lesions

[Series 1: us biopsy · 0.26mm/px · 13 of 22 slices shown]
[im 1/22]
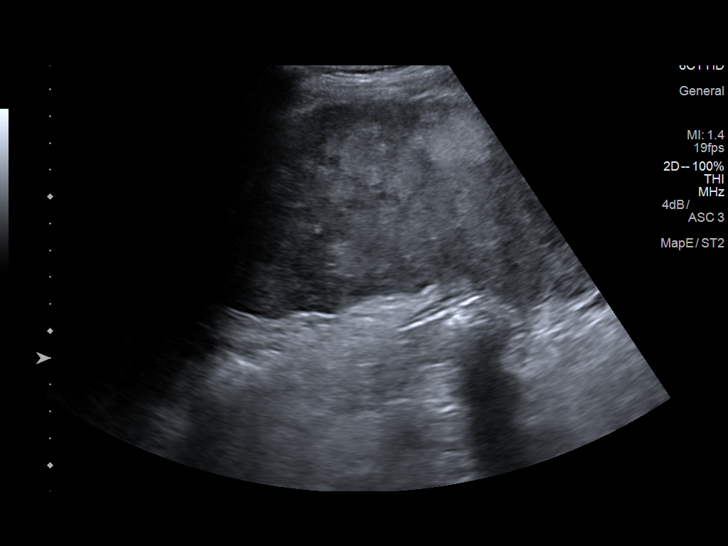
[im 3/22]
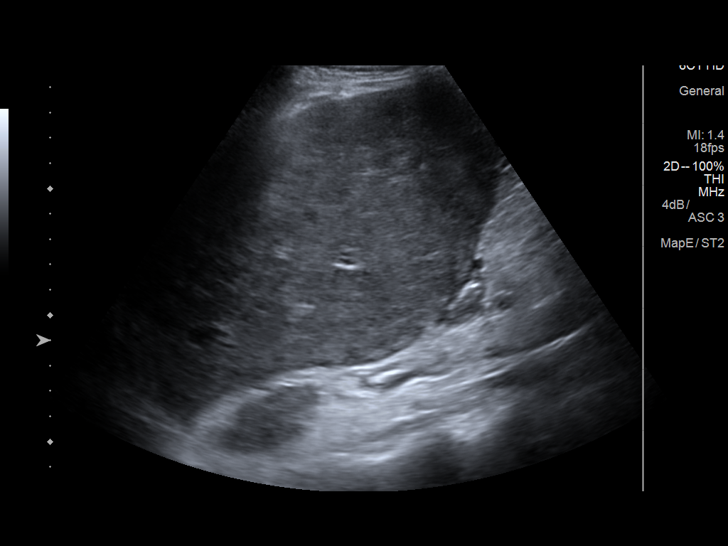
[im 5/22]
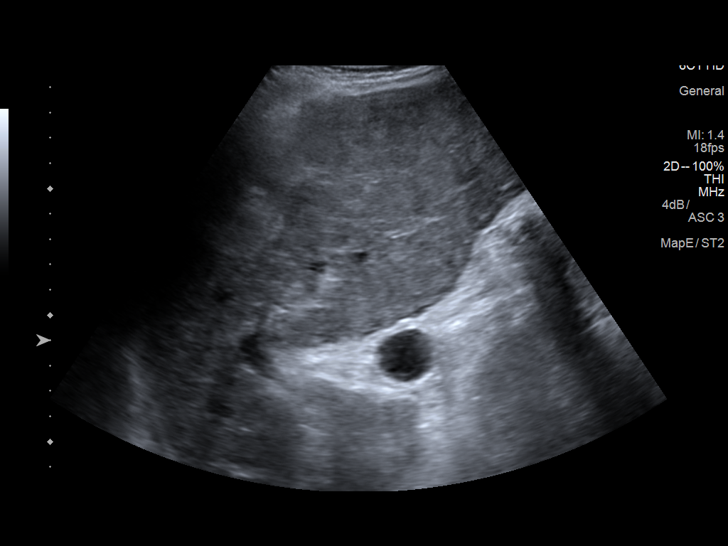
[im 6/22]
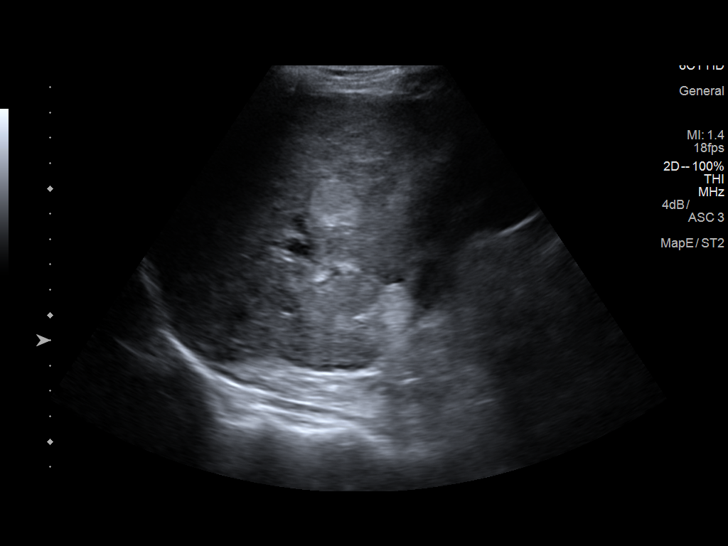
[im 8/22]
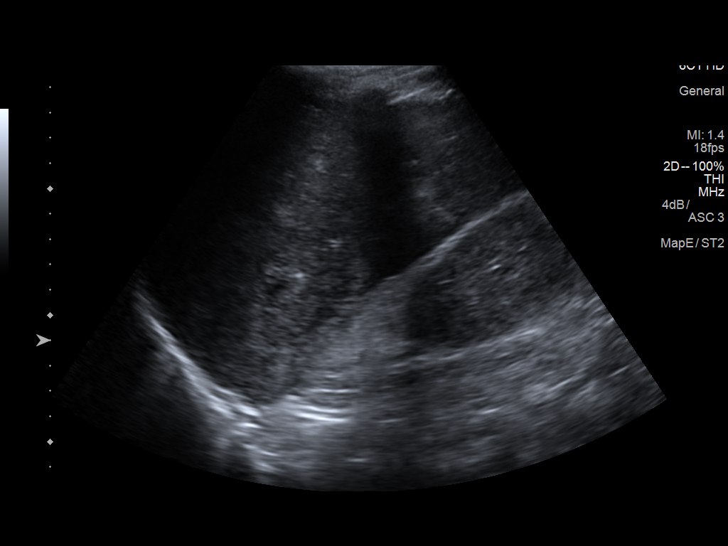
[im 10/22]
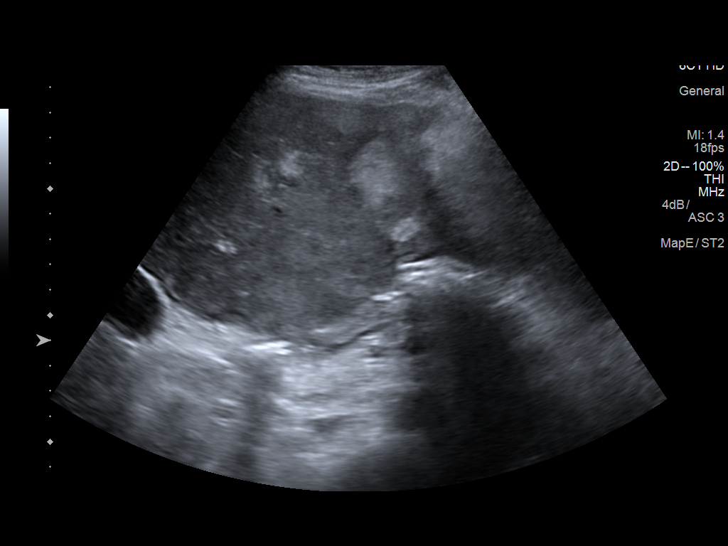
[im 12/22]
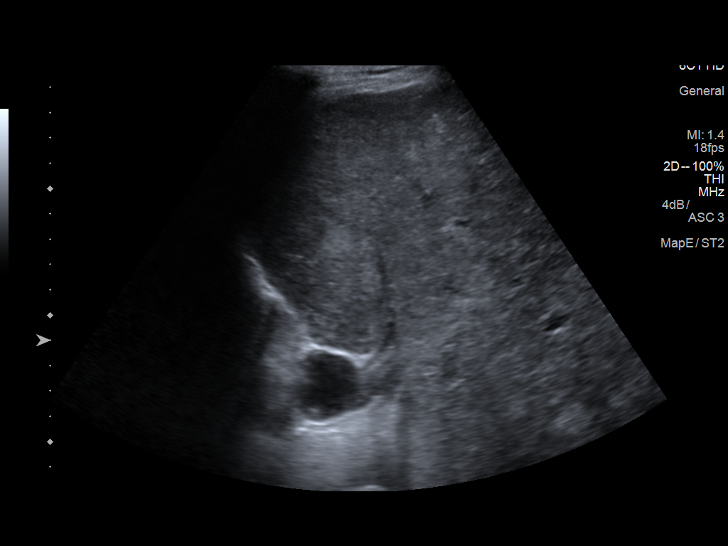
[im 13/22]
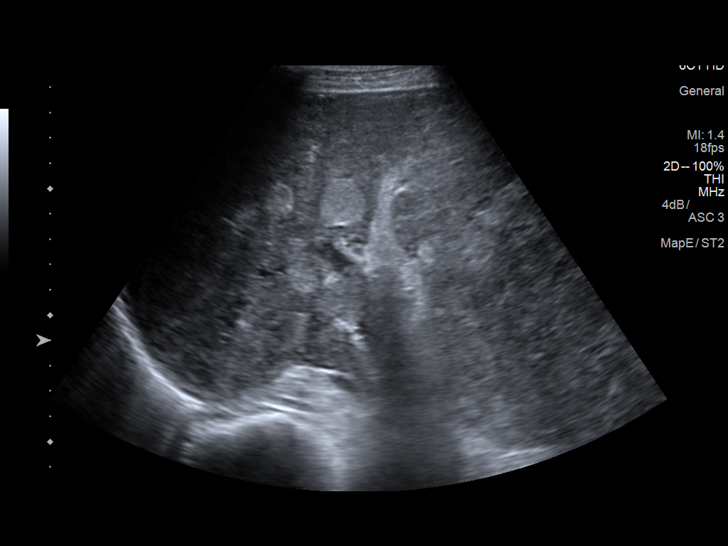
[im 15/22]
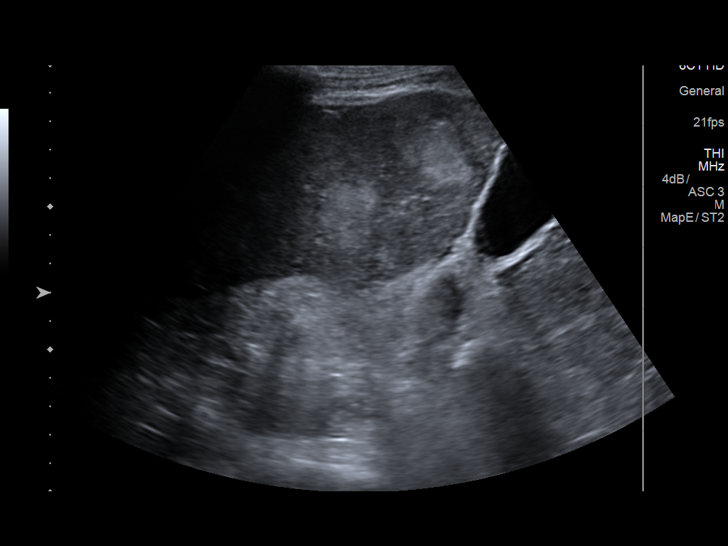
[im 17/22]
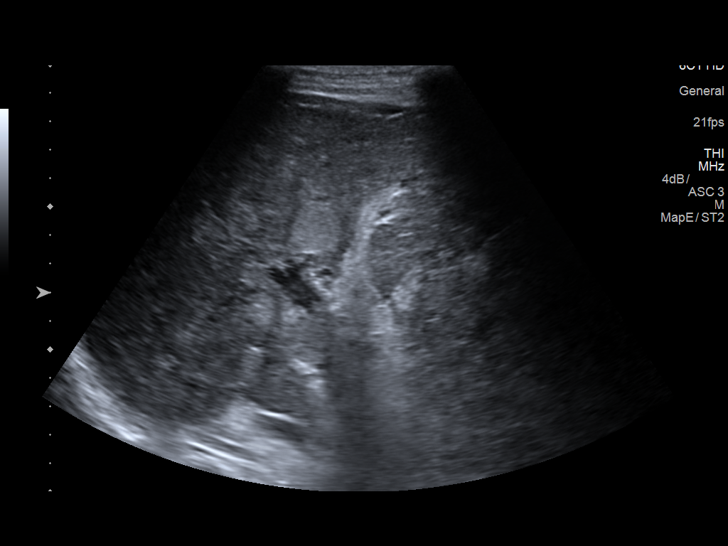
[im 18/22]
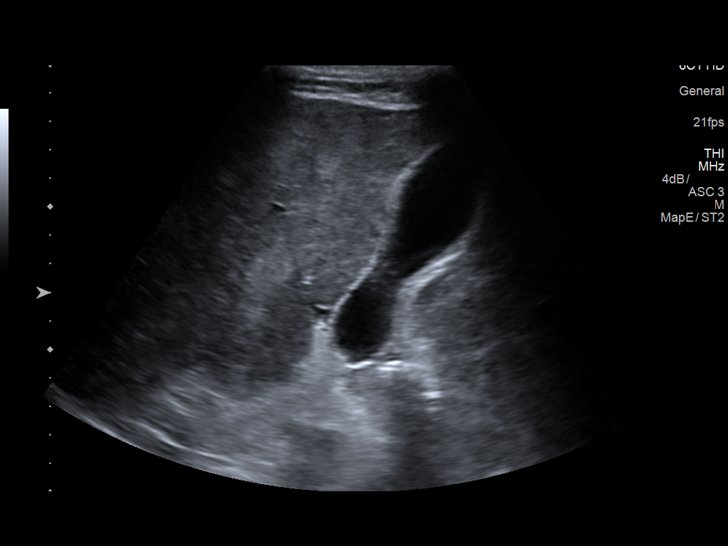
[im 20/22]
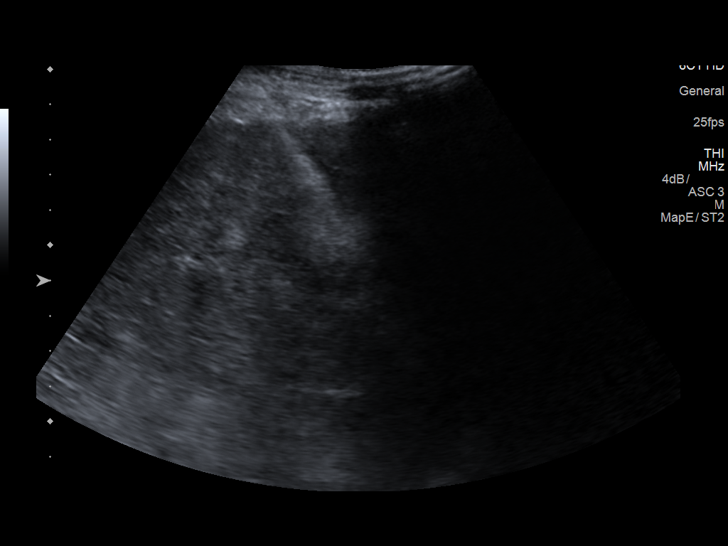
[im 22/22]
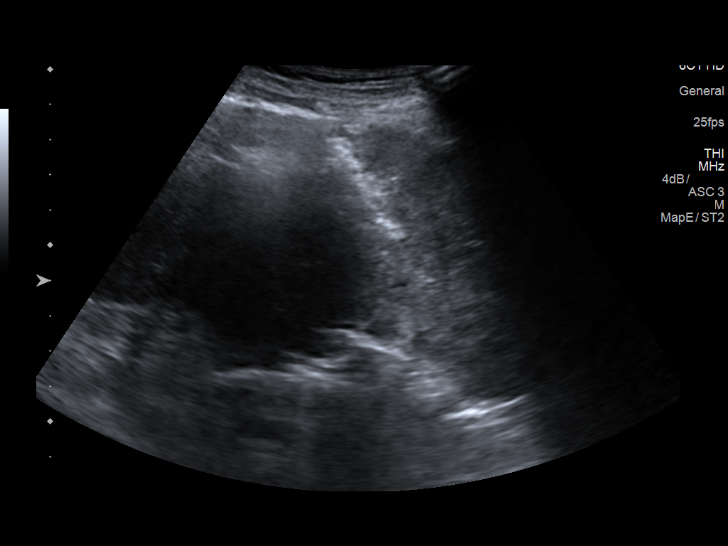

[13 of 22 positions shown; findings below may reference images not displayed]

EXAM:
ULTRASOUND-GUIDED BIOPSY OF A LIVER LESION.  CORE.

MEDICATIONS:
None.

ANESTHESIA/SEDATION:
Fentanyl 100 mcg IV; Versed 2 mg IV

Moderate Sedation Time:  11

The patient was continuously monitored during the procedure by the
interventional radiology nurse under my direct supervision.

FLUOROSCOPY TIME:  Fluoroscopy Time:  minutes  seconds ( mGy).

COMPLICATIONS:
None immediate.

PROCEDURE:
Informed written consent was obtained from the patient after a
thorough discussion of the procedural risks, benefits and
alternatives. All questions were addressed. Maximal Sterile Barrier
Technique was utilized including caps, mask, sterile gowns, sterile
gloves, sterile drape, hand hygiene and skin antiseptic. A timeout
was performed prior to the initiation of the procedure.

The upper abdomen was prepped with ChloraPrep in a sterile fashion,
and a sterile drape was applied covering the operative field. A
sterile gown and sterile gloves were used for the procedure.

Under sonographic guidance, an 17 gauge guide needle was advanced
into the left lobe liver lesion. Subsequently 2 18 gauge core
biopsies were obtained. The guide needle was removed. Final imaging
was performed.

Patient tolerated the procedure well without complication. Vital
sign monitoring by nursing staff during the procedure will continue
as patient is in the special procedures unit for post procedure
observation.
FINDINGS: The images document guide needle placement within the left lobe
liver lesion. Post biopsy images demonstrate no hemorrhage.
IMPRESSION: Successful ultrasound-guided core biopsy of a left lobe liver
lesion.

## 2018-02-16 IMAGING — CT CT CHEST W/O CM
2 of 3 series · 15 of 36 positions shown, 18 images · non-contrast
Comparison: None

CLINICAL DATA: History of liver cancer.

EXAM:
CT CHEST WITHOUT CONTRAST
TECHNIQUE: Multidetector CT imaging of the chest was performed following the
standard protocol without IV contrast.

[Series 2: thorax · axial · 0.67mm/px · z∈[-316,-40]mm · 12 of 163 slices shown, 15 images]
[im 13/163  mediastinal]
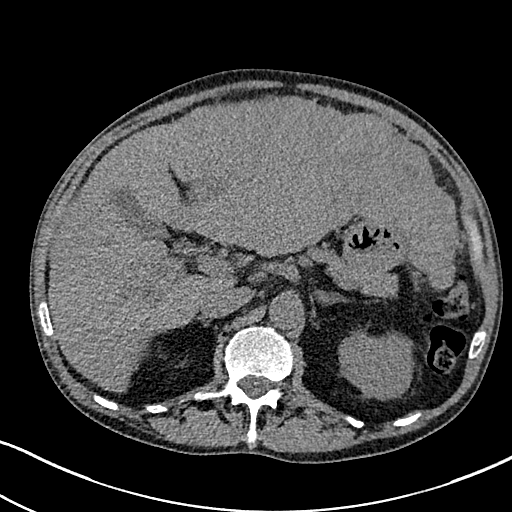
[im 13/163  lung]
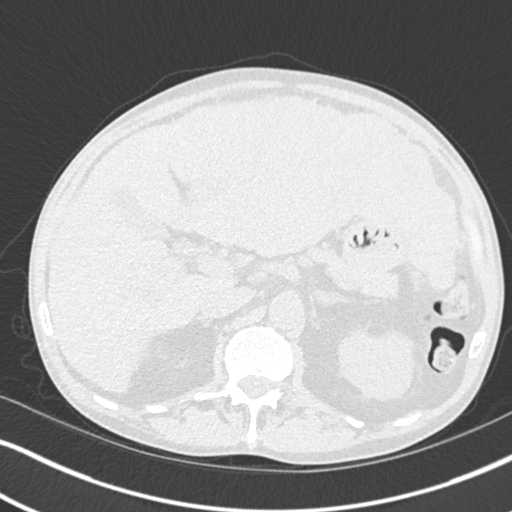
[im 25/163  lung]
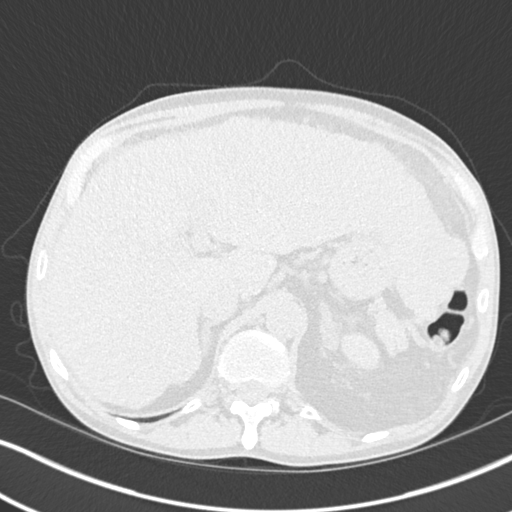
[im 37/163  lung]
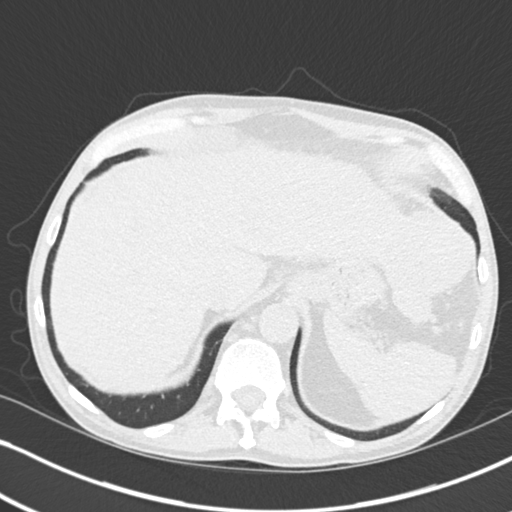
[im 49/163  lung]
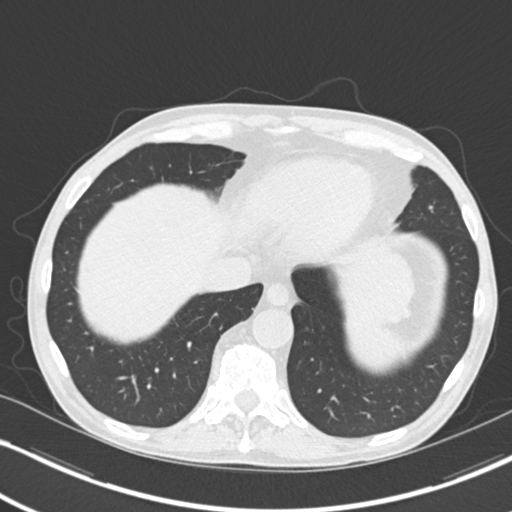
[im 61/163  mediastinal]
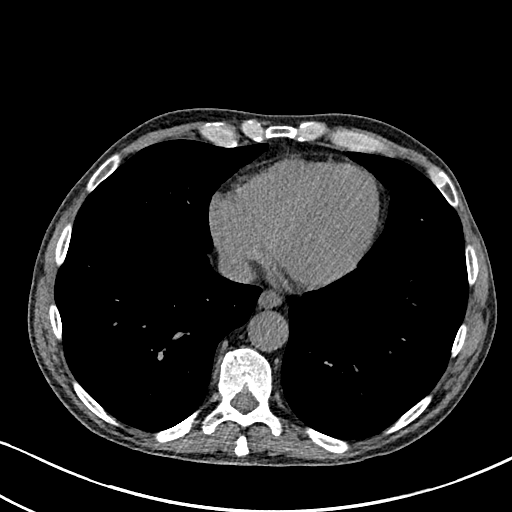
[im 61/163  lung]
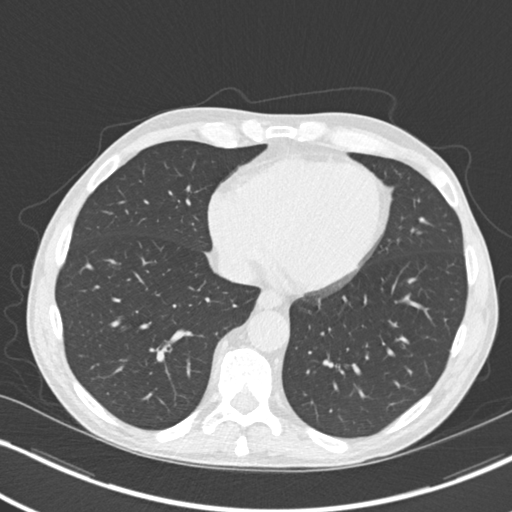
[im 73/163  lung]
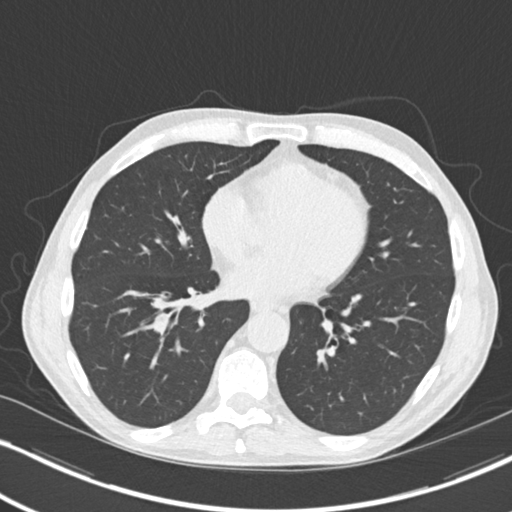
[im 91/163  lung]
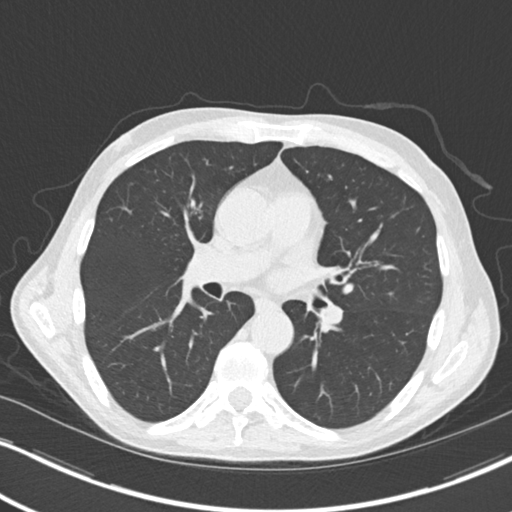
[im 103/163  lung]
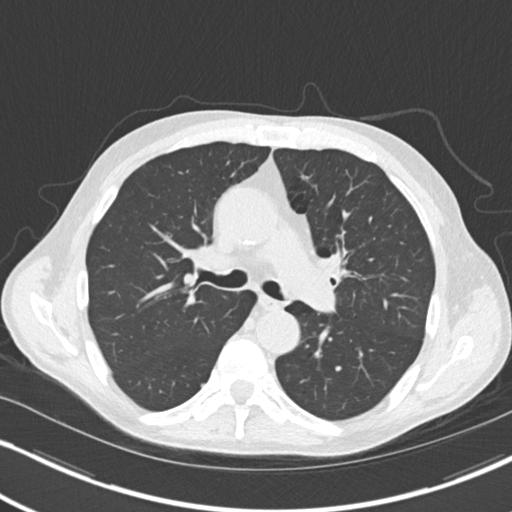
[im 115/163  mediastinal]
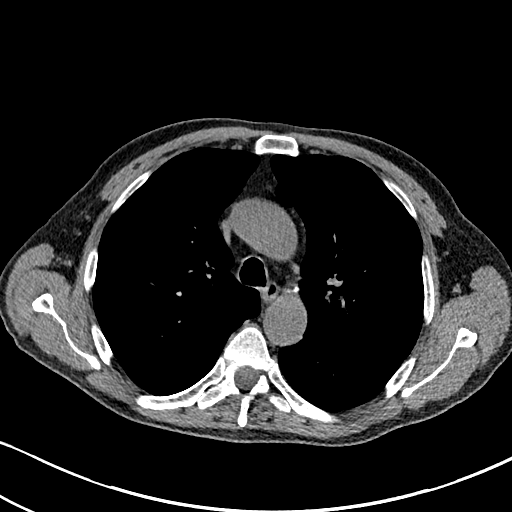
[im 115/163  lung]
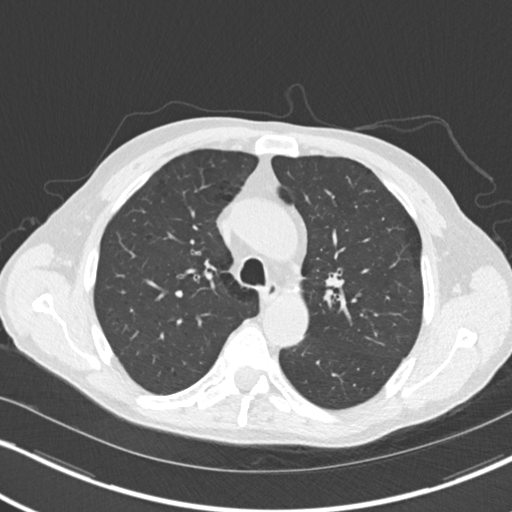
[im 127/163  lung]
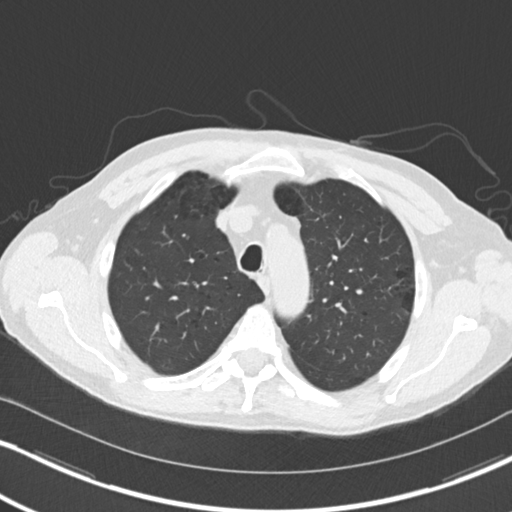
[im 139/163  lung]
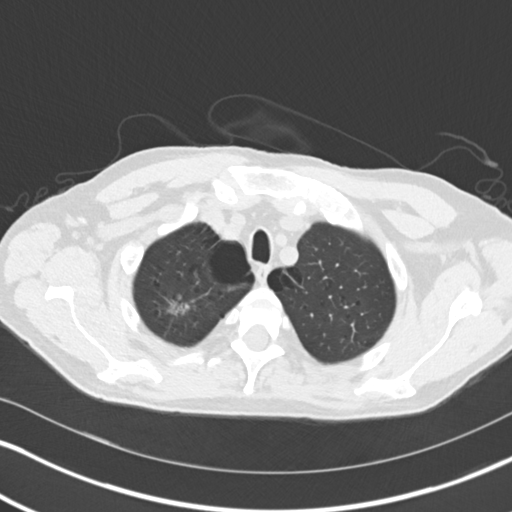
[im 151/163  lung]
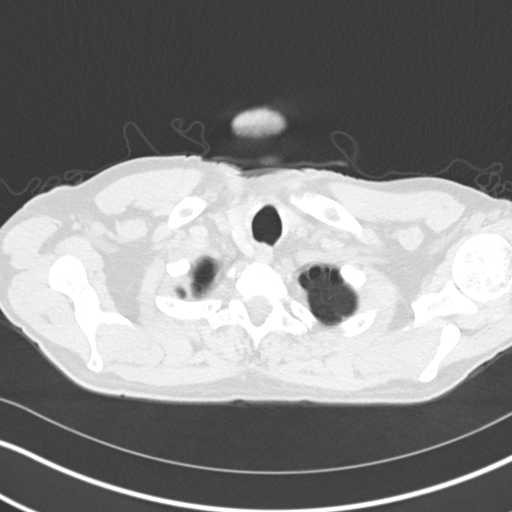

[Series 5: coronal · coronal · 0.64mm/px · 3 of 118 slices shown]
[im 24/118  lung]
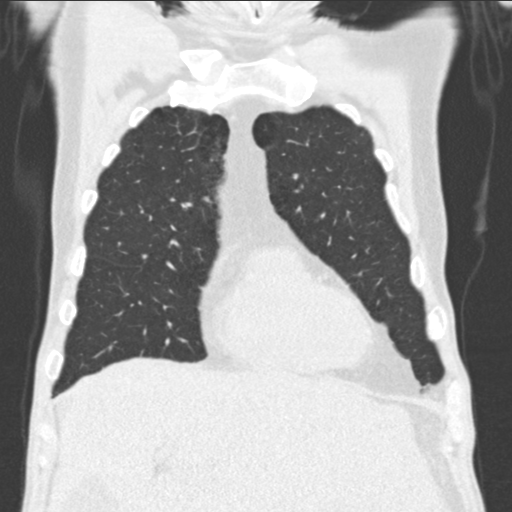
[im 47/118  lung]
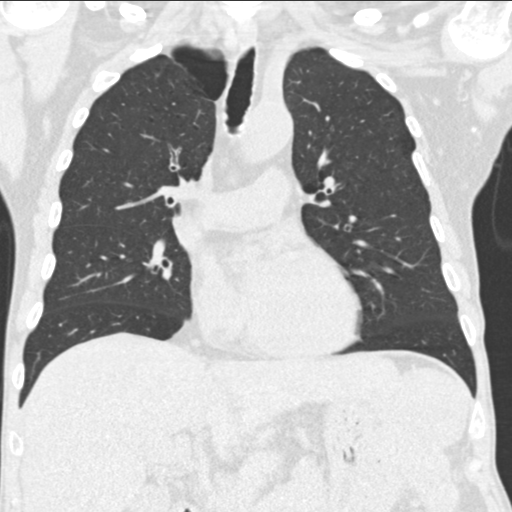
[im 71/118  lung]
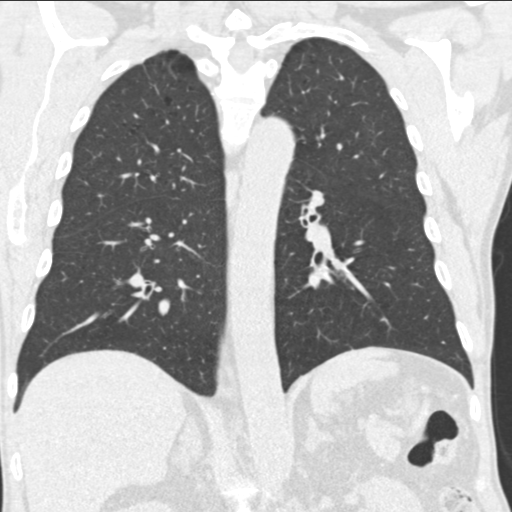

[15 of 36 positions shown; findings below may reference images not displayed]

FINDINGS: Cardiovascular: The heart size appears normal. No pericardial
effusion. Aortic atherosclerosis noted. Calcification in the LAD
coronary artery noted.

Mediastinum/Nodes: The trachea appears patent and is midline. Normal
appearance of the esophagus. There is no mediastinal or hilar
adenopathy. Left lobe of thyroid gland is absent or atrophic. No
mediastinal or hilar adenopathy. No axillary or supraclavicular
adenopathy.

Lungs/Pleura: There is no pleural fluid identified. Moderate changes
of paraseptal and centrilobular emphysema identified. Within the
right lung apex there is a part solid nodule which measures 1.4 cm,
image 25 of series 3. There is a subpleural nodule which overlies
the posterior medial right lower lobe. This measures 6 mm, image 69
of series 3.

Upper Abdomen: Cirrhotic appearing liver containing multiple masses
are identified. See report from MRI dated 10/12/2016 for more
details.

Musculoskeletal: Degenerative disc disease is identified within the
thoracic spine. There are no aggressive lytic or sclerotic bone
lesions.
IMPRESSION: 1. No acute cardiopulmonary abnormalities.
2. There is a sub solid nodule within the right upper lobe measuring
14 mm. Follow-up non-contrast CT recommended at 3-6 months to
confirm persistence. If unchanged, and solid component remains <6
mm, annual CT is recommended until 5 years of stability has been
established. If persistent these nodules should be considered highly
suspicious if the solid component of the nodule is 6 mm or greater
in size and enlarging. This recommendation follows the consensus
statement: Guidelines for Management of Incidental Pulmonary Nodules
Detected on CT Images: From the [HOSPITAL] 7449; Radiology
7449; [DATE].
3. Single solid nodule within the subpleural right lower lobe
measures 6 mm. Non-contrast chest CT at 6-12 months is recommended.
If the nodule is stable at time of repeat CT, then future CT at
18-24 months (from today's scan) is recommended for high-risk
patients. This recommendation follows the consensus statement:
Guidelines for Management of Incidental Pulmonary Nodules Detected
on CT Images:From the [HOSPITAL] 7449; published online
before print (10.1148/radiol.[id number]).
4. Aortic atherosclerosis and coronary artery calcification
5. Emphysema.
